# Patient Record
Sex: Female | Born: 1945 | Race: White | Hispanic: No | Smoking: Never smoker
Health system: Southern US, Community
[De-identification: ages and names within clinical notes are randomized; demographics above are authoritative.]

## PROBLEM LIST (undated history)

## (undated) DIAGNOSIS — N059 Unspecified nephritic syndrome with unspecified morphologic changes: Secondary | ICD-10-CM

## (undated) HISTORY — PX: RADICAL HYSTERECTOMY WITH TRANSPOSITION OF OVARIES: SHX6222

## (undated) HISTORY — DX: Unspecified nephritic syndrome with unspecified morphologic changes: N05.9

---

## 1973-05-14 HISTORY — PX: APPENDECTOMY: SHX54

## 1973-05-14 HISTORY — PX: ABDOMINAL HYSTERECTOMY: SHX81

## 1994-05-14 HISTORY — PX: FRACTURE SURGERY: SHX138

## 1996-05-14 HISTORY — PX: CHOLECYSTECTOMY: SHX55

## 2007-05-15 HISTORY — PX: CATARACT EXTRACTION: SUR2

## 2009-03-24 ENCOUNTER — Ambulatory Visit (HOSPITAL_COMMUNITY): Admission: RE | Admit: 2009-03-24 | Discharge: 2009-03-24 | Payer: Self-pay | Admitting: Pulmonary Disease

## 2010-05-14 LAB — HM DEXA SCAN: HM Dexa Scan: ABNORMAL

## 2010-05-14 LAB — HM PAP SMEAR: HM Pap smear: NORMAL

## 2010-06-04 ENCOUNTER — Encounter: Payer: Self-pay | Admitting: Pulmonary Disease

## 2011-05-15 LAB — HM MAMMOGRAPHY: HM Mammogram: NORMAL

## 2014-07-23 ENCOUNTER — Ambulatory Visit: Payer: Self-pay

## 2014-08-05 ENCOUNTER — Ambulatory Visit: Payer: Self-pay | Admitting: Obstetrics and Gynecology

## 2014-08-05 LAB — CREATININE, SERUM
Creatinine: 0.77 mg/dL
EGFR (African American): >60
EGFR (Non-African Amer.): >60

## 2014-08-26 ENCOUNTER — Ambulatory Visit
Admit: 2014-08-26 | Disposition: A | Discharge status: Home / Self Care | Payer: Self-pay | Admitting: Obstetrics and Gynecology

## 2014-08-26 DIAGNOSIS — Z01818 Encounter for other preprocedural examination: Secondary | ICD-10-CM

## 2014-08-26 LAB — CBC
HCT: 45 % (ref 35.0–47.0)
HGB: 14.8 g/dL (ref 12.0–16.0)
MCH: 31.7 pg (ref 26.0–34.0)
MCHC: 32.8 g/dL (ref 32.0–36.0)
MCV: 96 fL (ref 80–100)
PLATELETS: 241 10*3/uL (ref 150–440)
RBC: 4.66 10*6/uL (ref 3.80–5.20)
RDW: 12.8 % (ref 11.5–14.5)
WBC: 7.4 10*3/uL (ref 3.6–11.0)

## 2014-08-26 LAB — BASIC METABOLIC PANEL
Anion Gap: 6 — ABNORMAL LOW (ref 7–16)
BUN: 19 mg/dL
CHLORIDE: 107 mmol/L
CO2: 29 mmol/L
CREATININE: 0.73 mg/dL
Calcium, Total: 9 mg/dL
Glucose: 95 mg/dL
POTASSIUM: 3.6 mmol/L
Sodium: 142 mmol/L

## 2014-08-30 ENCOUNTER — Ambulatory Visit
Admit: 2014-08-30 | Disposition: A | Discharge status: Home / Self Care | Payer: Self-pay | Attending: Obstetrics and Gynecology | Admitting: Obstetrics and Gynecology

## 2014-08-30 HISTORY — PX: LAPAROSCOPY: SHX197

## 2014-09-06 LAB — SURGICAL PATHOLOGY

## 2014-09-12 NOTE — Op Note (Signed)
PATIENT NAME:  Kim Ayala, KLICH MR#:  [id number] DATE OF BIRTH:  1946-04-30  DATE OF PROCEDURE:  08/30/2014  PREOPERATIVE DIAGNOSES: Left adnexal mass.   POSTOPERATIVE DIAGNOSIS: Left adnexal mass and dense pelvic adhesive disease.   SURGICAL PROCEDURE: Laparoscopic bilateral salpingo-oophorectomy and adhesiolysis.   SURGEON: Alanda Slim. DeFrancesco, MD.   FIRST ASSISTANT: Anika S. Marcelline Mates, MD.    SECOND ASSISTANT: Rickard Patience, PA-S.   ANESTHESIA: General.   INDICATIONS: The patient is a 69 year old white female with a 10 cm adnexal mass. Preoperative ultrasound demonstrated a simple cyst. MRI showed minimal wall calcification without excrescences or nodularity. CA-125 was normal.   FINDINGS AT SURGERY: Revealed a 10 cm left adnexal mass with adjacent adhesions between the omentum, the anterior abdominal wall, and the pelvic sidewall. These were taken down sharply and bluntly. The right tube and ovary likewise were adhesed against the pelvic sidewall. Adhesiolysis had to be performed in order to mobilize the right tube and ovary. Both were excised. The ureters were noted to have normal function and course following completion of the BSO.     DESCRIPTION OF PROCEDURE: The patient was brought to the operating room where she was placed in the supine position. General endotracheal anesthesia was induced without difficulty. She was placed in dorsal lithotomy position using bumblebee stirrups. A ChloraPrep and Betadine abdominal, perineal, and intravaginal prep and drape was performed in standard fashion. A red Robinson catheter was used to drain 100 mL of urine from the bladder. A sponge stick was placed into the vagina to help facilitate anatomic orientation during the surgery. A subumbilical vertical incision 5 mm in length was made. The Optiview laparoscopic trocar system was placed under direct visualization with aid of the camera and revealed no bowel or vascular injury. Extensive adhesions were  noted along the pelvic sidewalls as well as the anterior abdominal wall and involving the left and right adnexa. An 11 mm port was placed in the right lower quadrant under direct visualization. The adhesiolysis was performed with the Ace Harmonic scalpel. Once able to place a left-sided 5 mm port, this was accomplished under direct visualization. Further adhesiolysis was performed in order to restore normal anatomic orientation. Once satisfied with the adhesiolysis the right tube and ovary were excised in standard fashion. The Harmonic scalpel was used to crossclamp, desiccate, and cut the right infundibulopelvic ligament as well as the remainder of the mesosalpinx that was present. Once this structure was mobilized and excised it was placed in the cul-de-sac for removal at a later time. The left adnexal mass was then sequentially taken down from its pedicles with the aid of the graspers and the Ace Harmonic scalpel. Adhesiolysis was performed in order to get the structure away from the ureter on the left pelvic sidewall. Bowel adhesions were taken down as well as sidewall adhesions. Once adequately mobilized the infundibulopelvic ligament was clamped, desiccated, and cut. This was followed by further adhesiolysis of the remainder of the mesosalpinx. Once mobilized the specimen was placed into a retrieval bag. The adnexal structure could not be placed in its entirety in that bag and therefore it had to be decompressed with needle aspiration. 60 mL of fluid were removed from the cyst with decompression of the cyst to allow it to gain entry into the removal bag. This was followed by further removal of the cyst fluid with aspiration once it was brought to the anterior abdominal wall. The remainder of the mass was removed through the right lower quadrant incision  without difficulty. No apparent spill of fluid was noted. The right tube and ovary were then taken out with the aid of an Endo Catch instrument. This was  followed by copious irrigation of the pelvis along with aspiration of the irrigant fluid. Good hemostasis was noted. All instrumentation was then removed from the abdominopelvic cavity. The pneumoperitoneum was released. All incisions were closed before completion of the surgical procedure. The fascia was closed with 0 Vicryl in a simple running manner. The subcutaneous tissues were brought together with subcuticular stitches of 4-0 plain. Telfa pressure dressings were applied. The patient was then awakened, extubated, and taken to the recovery room in satisfactory condition.   ESTIMATED BLOOD LOSS: Minimal.   IV FLUIDS: 1500 mL.   URINE OUTPUT: 100 mL.   COUNTS: All instrument, needles, and sponge counts were verified as correct.    ____________________________ Alanda Slim. DeFrancesco, MD mad:bu D: 08/30/2014 13:22:55 ET T: 08/30/2014 17:47:22 ET JOB#: [id number]  cc: Hassell Done A. DeFrancesco, MD, <Dictator> [facility] MD ELECTRONICALLY SIGNED 09/06/2014 8:49

## 2014-10-18 DIAGNOSIS — L905 Scar conditions and fibrosis of skin: Secondary | ICD-10-CM

## 2014-10-18 DIAGNOSIS — N289 Disorder of kidney and ureter, unspecified: Secondary | ICD-10-CM | POA: Insufficient documentation

## 2014-10-18 DIAGNOSIS — N059 Unspecified nephritic syndrome with unspecified morphologic changes: Secondary | ICD-10-CM

## 2014-10-18 DIAGNOSIS — M810 Age-related osteoporosis without current pathological fracture: Secondary | ICD-10-CM

## 2014-10-18 DIAGNOSIS — N9489 Other specified conditions associated with female genital organs and menstrual cycle: Secondary | ICD-10-CM

## 2014-10-18 DIAGNOSIS — Z8742 Personal history of other diseases of the female genital tract: Secondary | ICD-10-CM | POA: Insufficient documentation

## 2014-10-18 HISTORY — DX: Unspecified nephritic syndrome with unspecified morphologic changes: N05.9

## 2014-10-20 ENCOUNTER — Encounter: Payer: Self-pay | Admitting: Obstetrics and Gynecology

## 2014-10-20 ENCOUNTER — Ambulatory Visit (INDEPENDENT_AMBULATORY_CARE_PROVIDER_SITE_OTHER): Payer: Commercial Managed Care - HMO | Admitting: Obstetrics and Gynecology

## 2014-10-20 VITALS — BP 116/67 | HR 75 | Ht 60.0 in | Wt 133.9 lb

## 2014-10-20 DIAGNOSIS — L905 Scar conditions and fibrosis of skin: Secondary | ICD-10-CM | POA: Insufficient documentation

## 2014-10-20 DIAGNOSIS — N958 Other specified menopausal and perimenopausal disorders: Secondary | ICD-10-CM | POA: Diagnosis not present

## 2014-10-20 DIAGNOSIS — E8940 Asymptomatic postprocedural ovarian failure: Secondary | ICD-10-CM

## 2014-10-20 NOTE — Patient Instructions (Addendum)
1. Reassurance is given regarding postoperative findings. Patient does understand that the surgical sites sites scarring is lessening over time.  2. Recommend continued observation.  3. Consider plastic surgery consultation if desired.  4. RTC in March 2017 for Medicare exam.

## 2014-10-20 NOTE — Progress Notes (Signed)
GYN ENCOUNTER NOTE  Subjective:       Kim Ayala is a 69 y.o. G40P1001 female is here for gynecologic evaluation of the following issues:  1. Abdominal soft tissue scarring. 2.  Status post laparoscopic BSO. 3.  Vasomotor symptoms.  Kim Ayala presents today for follow-up on the above issues.  She reports normal bowel and bladder function.  She does not have chronic pain; however, she does experience some discomfort when working out at the gym with pain at the right lower quadrant laparoscopy port sites. Patient is also experiencing some hot flashes and night sweats which are not significant enough to warrant medical treatment.   Gynecologic History No LMP recorded. Patient has had a hysterectomy. Contraception: status post hysterectomy   Obstetric History OB History  Gravida Para Term Preterm AB SAB TAB Ectopic Multiple Living  1 1 1       1     # Outcome Date GA Lbr Len/2nd Weight Sex Delivery Anes PTL Lv  1 Term 1964   7 lb 12 oz (3.515 kg) M Vag-Spont   Y      Past Medical History  Diagnosis Date  . 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 Appendectomy  1975  . Fracture surgery  1996    bone  . Cholecystectomy  1998  . Cataract extraction  2009  . Abdominal hysterectomy  1975    unsure of type  . Laparoscopy Bilateral 08/30/2014    salpingo-oophorectomy and adhesiolysis    Current Outpatient Prescriptions on File Prior to Visit  Medication Sig Dispense Refill  . calcium carbonate (OS-CAL) 600 MG TABS tablet Take 600 mg by mouth.    . Multiple Vitamins-Minerals (MULTIVITAMIN WITH MINERALS) tablet Take 1 tablet by mouth daily.     No current facility-administered medications on file prior to visit.    Allergies  Allergen Reactions  . Asa [Aspirin]     History   Social History  . Marital Status: Divorced    Spouse Name: N/A  . Number of Children: N/A  . Years of Education: N/A   Occupational  History  . Not on file.   Social History Main Topics  . Smoking status: Never Smoker   . Smokeless tobacco: Never Used  . Alcohol Use: No  . Drug Use: No  . Sexual Activity: Not Currently   Other Topics Concern  . Not on file   Social History Narrative    Family History  Problem Relation Age of Onset  . Heart disease Mother     The following portions of the patient's history were reviewed and updated as appropriate: allergies, current medications, past family history, past medical history, past social history, past surgical history and problem list.  Review of Systems Review of Systems - General ROS: negative for - chills, fatigue, fever, malaise. POSITIVE- hot flashes, night sweats Hematological and Lymphatic ROS: negative for - bleeding problems or swollen lymph nodes Gastrointestinal ROS: negative for - abdominal pain, blood in stools, change in bowel habits and nausea/vomiting Musculoskeletal ROS: negative for - joint pain, muscle pain or muscular weakness Genito-Urinary ROS: negative for - change in menstrual cycle, dysmenorrhea, dyspareunia, dysuria, genital discharge, genital ulcers, hematuria, incontinence, irregular/heavy menses, nocturia or pelvic pain  Objective:   BP 116/67 mmHg  Pulse 75  Ht 5' (1.524 m)  Wt 133 lb 14.4 oz (60.737 kg)  BMI 26.15 kg/m2 CONSTITUTIONAL: Well-developed, well-nourished female in no acute distress.  HENT:  Normocephalic,  atraumatic.  SKIN: Skin is warm and dry. No rash noted. Not diaphoretic. No erythema. No pallor. NEUROLOGIC: Alert and oriented to person, place, and time. PSYCHIATRIC: Normal mood and affect. Normal behavior. Normal judgment and thought content. CARDIOVASCULAR:Not Examined RESPIRATORY: Not Examined BREASTS: Not Examined ABDOMEN: Soft, non distended; Non tender.  No Organomegaly. Laparoscopy incision sites are well-healed without evidence of hernia.  The patient does have a moderate soft tissue pannus with some  asymmetry on the right; superficial subcutaneous tissue is retracted towards the right lower quadrant fascial port site; no hernia.( asymmetry is less than previously noted 4 weeks ago.) PELVIC: Deferred MUSCULOSKELETAL: Normal range of motion. No tenderness.  No cyanosis, clubbing, or edema.     Assessment:   1.  Surgical menopause, minimally symptomatic, not desiring hormonal therapies. 2.  Abdominal wall, scarring, asymptomatic.   Plan:  1.  Reassurance is given regarding postoperative findings.  Patient does understand that the surgical sites sites scarring is lessening over time. 2.  Recommend continued observation. 3.  Consider plastic surgery consultation if desired. 4.  RTC in March 2017 for Medicare exam.  A total of 15 minutes were spent face-to-face with the patient during this encounter and over half of that time dealt with counseling and coordination of care.

## 2015-03-21 ENCOUNTER — Ambulatory Visit (INDEPENDENT_AMBULATORY_CARE_PROVIDER_SITE_OTHER): Payer: Commercial Managed Care - HMO | Admitting: Family Medicine

## 2015-03-21 ENCOUNTER — Encounter: Payer: Self-pay | Admitting: Family Medicine

## 2015-03-21 VITALS — BP 99/58 | HR 64 | Resp 16 | Ht 60.5 in | Wt 132.8 lb

## 2015-03-21 DIAGNOSIS — Z1231 Encounter for screening mammogram for malignant neoplasm of breast: Secondary | ICD-10-CM

## 2015-03-21 DIAGNOSIS — Z23 Encounter for immunization: Secondary | ICD-10-CM | POA: Diagnosis not present

## 2015-03-21 DIAGNOSIS — Z0000 Encounter for general adult medical examination without abnormal findings: Secondary | ICD-10-CM | POA: Diagnosis not present

## 2015-03-21 NOTE — Progress Notes (Signed)
Patient: Kim Ayala, Female    DOB: 14-Sep-1945, 69 y.o.   MRN: [id number] Visit Date: 03/21/2015  Today's Provider: Dicky Doe, MD   Chief Complaint  Patient presents with  . Medicare Wellness    subsiquent    Subjective:    Annual wellness visit Kim Ayala is a 69 y.o. female who presents today for her Subsequent Annual Wellness Visit. She feels fairly well. She reports exercising . She reports she is sleeping fairly well.   ----------------------------------------------------------- HPI  Review of Systems  Social History   Social History  . Marital Status: Divorced    Spouse Name: N/A  . Number of Children: N/A  . Years of Education: N/A   Occupational History  . Not on file.   Social History Main Topics  . Smoking status: Never Smoker   . Smokeless tobacco: Never Used  . Alcohol Use: No  . Drug Use: No  . Sexual Activity: Not Currently   Other Topics Concern  . Not on file   Social History Narrative    Patient Active Problem List   Diagnosis Date Noted  . Scar of abdominal wall 10/20/2014  . Kidney disease 10/18/2014  . History of heavy periods 10/18/2014  . Osteoporosis 10/18/2014  . 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 Adnexal mass 10/18/2014    Past Surgical History  Procedure Laterality Date  . Appendectomy  1975  . Fracture surgery  1996    bone  . Cholecystectomy  1998  . Cataract extraction  2009  . Abdominal hysterectomy  1975    unsure of type  . Laparoscopy Bilateral 08/30/2014    salpingo-oophorectomy and adhesiolysis  . Radical hysterectomy with transposition of ovaries      Her family history includes Heart disease in her mother.    Previous Medications   MULTIPLE VITAMINS-MINERALS (MULTIVITAMIN WITH MINERALS) TABLET    Take 1 tablet by mouth daily.    Patient Care Team: Arlis Porta., MD as PCP - General (Family Medicine)     Objective:   Vitals: BP 99/58 mmHg  Pulse 64   Resp 16  Ht 5' 0.5" (1.537 m)  Wt 132 lb 12.8 oz (60.238 kg)  BMI 25.50 kg/m2  Physical Exam  Activities of Daily Living In your present state of health, do you have any difficulty performing the following activities: 03/21/2015  Hearing? N  Vision? N  Difficulty concentrating or making decisions? N  Walking or climbing stairs? N  Dressing or bathing? N  Doing errands, shopping? N  Preparing Food and eating ? N  Using the Toilet? N  In the past six months, have you accidently leaked urine? Y  Do you have problems with loss of bowel control? N  Managing your Medications? N  Managing your Finances? N  Housekeeping or managing your Housekeeping? N    Fall Risk Assessment Fall Risk  03/21/2015  Falls in the past year? No     Patient reports there are not safety devices in place in shower at home.   Depression Screen PHQ 2/9 Scores 03/21/2015  PHQ - 2 Score 0     MMSE MMSE - Mini Mental State Exam 03/21/2015  Orientation to time 5  Orientation to Place 5  Registration 3  Attention/ Calculation 5  Recall 3  Language- name 2 objects 2  Language- repeat 1  Language- follow 3 step command 3  Language- read & follow direction 1  Write a sentence 1  Copy  design 1  Total score 30     Assessment & Plan:     Annual Wellness Visit  Reviewed patient's Family Medical History Reviewed and updated list of patient's medical providers Assessment of cognitive impairment was done Assessed patient's functional ability Established a written schedule for health screening [place] Completed and Reviewed  Exercise Activities and Dietary recommendations Goals    . Keep good health     Remain active.         There is no immunization history on file for this patient.  Health Maintenance  Topic Date Due  . PNA vac Low Risk Adult (1 of 2 - PCV13) 04/25/2015 (Originally 11/20/2009)  . MAMMOGRAM  06/06/2015 (Originally 05/14/2013)  . Hepatitis C  Screening  03/26/2016 (Originally 05-Aug-1945)  . ZOSTAVAX  04/02/2016 (Originally 11/20/2004)  . INFLUENZA VACCINE  12/13/2015  . TETANUS/TDAP  05/14/2017  . COLONOSCOPY  03/20/2025  . DEXA SCAN  Completed     Discussed health benefits of physical activity, and encouraged her to engage in regular exercise appropriate for her age and condition.    ------------------------------------------------------------------------------------------------------------   Problem List Items Addressed This Visit    None    Visit Diagnoses    Medicare annual wellness visit, subsequent    -  Primary    Encounter for screening mammogram for breast cancer        Relevant Orders    MM Digital Screening        Larene Beach, MD [facility]  03/21/2015  1. Medicare annual wellness visit, subsequent   2. Encounter for screening mammogram for breast cancer - MM Digital Screening; Future

## 2015-03-21 NOTE — Patient Instructions (Addendum)
Health Maintenance  Topic Date Due  . Hepatitis C Screening  18-May-1945  . MAMMOGRAM  05/14/2013  . ZOSTAVAX  05/14/2016 (Originally 11/20/2004)  . PNA vac Low Risk Adult (1 of 2 - PCV13) 05/14/2016 (Originally 11/20/2009)  . INFLUENZA VACCINE  12/13/2015  . TETANUS/TDAP  05/14/2017  . COLONOSCOPY  03/20/2025  . DEXA SCAN  Completed    To get Prevnar-13 within 1 month.

## 2015-04-12 ENCOUNTER — Ambulatory Visit: Payer: Self-pay | Admitting: Family Medicine

## 2015-07-19 ENCOUNTER — Ambulatory Visit (INDEPENDENT_AMBULATORY_CARE_PROVIDER_SITE_OTHER): Payer: Commercial Managed Care - HMO | Admitting: Family Medicine

## 2015-07-19 ENCOUNTER — Encounter: Payer: Self-pay | Admitting: Family Medicine

## 2015-07-19 VITALS — BP 103/50 | HR 58 | Resp 16 | Ht 60.25 in | Wt 132.6 lb

## 2015-07-19 DIAGNOSIS — Z0000 Encounter for general adult medical examination without abnormal findings: Secondary | ICD-10-CM | POA: Diagnosis not present

## 2015-07-19 DIAGNOSIS — Z23 Encounter for immunization: Secondary | ICD-10-CM

## 2015-07-19 DIAGNOSIS — Z1231 Encounter for screening mammogram for malignant neoplasm of breast: Secondary | ICD-10-CM

## 2015-07-19 DIAGNOSIS — N289 Disorder of kidney and ureter, unspecified: Secondary | ICD-10-CM

## 2015-07-19 NOTE — Progress Notes (Signed)
. Name: Kim Ayala   MRN: [id number]    DOB: 10/08/1945   Date:07/19/2015       Progress Note  Subjective  Chief Complaint  Chief Complaint  Patient presents with  . Annual Exam    HPI Here for complete yearly health maintenance physical.  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  Feels physically ok.  Some emotional problems related to deaths of friends and family. No problem-specific assessment & plan notes found for this encounter.   Past Medical History  Diagnosis Date  . 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 Appendectomy  1975  . Fracture surgery  1996    bone  . Cholecystectomy  1998  . Cataract extraction  2009  . Abdominal hysterectomy  1975    unsure of type  . Laparoscopy Bilateral 08/30/2014    salpingo-oophorectomy and adhesiolysis  . Radical hysterectomy with transposition of ovaries      Family History  Problem Relation Age of Onset  . Heart disease Mother     Social History   Social History  . Marital Status: Divorced    Spouse Name: N/A  . Number of Children: N/A  . Years of Education: N/A   Occupational History  . Not on file.   Social History Main Topics  . Smoking status: Never Smoker   . Smokeless tobacco: Never Used  . Alcohol Use: No  . Drug Use: No  . Sexual Activity: Not Currently   Other Topics Concern  . Not on file   Social History Narrative     Current outpatient prescriptions:  .  Acetaminophen 500 MG coapsule, , Disp: , Rfl:  .  Multiple Vitamins-Minerals (MULTIVITAMIN WITH MINERALS) tablet, Take 1 tablet by mouth daily., Disp: , Rfl:  .  vitamin C (ASCORBIC ACID) 500 MG tablet, , Disp: , Rfl:   Allergies  Allergen Reactions  . Asa [Aspirin]      Review of Systems  Constitutional: Negative for fever, chills, weight loss and malaise/fatigue.  HENT: Negative for hearing loss.   Eyes: Negative for blurred vision and double vision.   Respiratory: Negative for cough, shortness of breath and wheezing.   Cardiovascular: Negative for chest pain, palpitations and leg swelling.  Gastrointestinal: Negative for heartburn, abdominal pain and blood in stool.  Genitourinary: Negative for dysuria, urgency and frequency.  Musculoskeletal: Negative for myalgias and joint pain.  Skin: Negative for rash.  Neurological: Negative for dizziness, tremors, weakness and headaches.  Psychiatric/Behavioral: Negative for depression. The patient is not nervous/anxious and does not have insomnia.       Objective  Filed Vitals:   07/19/15 1411  BP: 103/50  Pulse: 58  Resp: 16  Height: 5' 0.25" (1.53 m)  Weight: 132 lb 9.6 oz (60.147 kg)    Physical Exam  Constitutional: She is well-developed, well-nourished, and in no distress. No distress.  HENT:  Head: Normocephalic and atraumatic.  Vitals reviewed.      No results found for this or any previous visit (from the past 2160 hour(s)).   Assessment & Plan  Problem List Items Addressed This Visit      Genitourinary   Kidney disease   Relevant Orders   Comprehensive Metabolic Panel (CMET)   CBC with Differential   Lipid Profile     Other   Encounter for screening mammogram for breast cancer   Relevant Orders   MM Digital Screening   Need for  influenza vaccination   Health maintenance examination - Primary    Other Visit Diagnoses    Immunization due           Meds ordered this encounter  Medications  . vitamin C (ASCORBIC ACID) 500 MG tablet    Sig:   . Acetaminophen 500 MG coapsule    Sig:    1. Health maintenance examination   2. Encounter for screening mammogram for breast cancer  - MM Digital Screening; Future  3. Kidney disease  - Comprehensive Metabolic Panel (CMET) - CBC with Differential - Lipid Profile  4. Need for influenza vaccination   5. Immunization due

## 2015-09-24 LAB — LIPID PANEL
CHOL/HDL RATIO: 2.7 ratio (ref 0.0–4.4)
Cholesterol, Total: 162 mg/dL (ref 100–199)
HDL: 59 mg/dL (ref >39)
LDL CALC: 84 mg/dL (ref 0–99)
Triglycerides: 95 mg/dL (ref 0–149)
VLDL CHOLESTEROL CAL: 19 mg/dL (ref 5–40)

## 2015-09-24 LAB — COMPREHENSIVE METABOLIC PANEL
A/G RATIO: 1.7 (ref 1.2–2.2)
ALT: 17 IU/L (ref 0–32)
AST: 26 IU/L (ref 0–40)
Albumin: 4 g/dL (ref 3.5–4.8)
Alkaline Phosphatase: 103 IU/L (ref 39–117)
BUN/Creatinine Ratio: 18 (ref 12–28)
BUN: 16 mg/dL (ref 8–27)
Bilirubin Total: 0.3 mg/dL (ref 0.0–1.2)
CALCIUM: 8.9 mg/dL (ref 8.7–10.3)
CO2: 24 mmol/L (ref 18–29)
CREATININE: 0.87 mg/dL (ref 0.57–1.00)
Chloride: 102 mmol/L (ref 96–106)
GFR, EST AFRICAN AMERICAN: 78 mL/min/{1.73_m2} (ref >59)
GFR, EST NON AFRICAN AMERICAN: 68 mL/min/{1.73_m2} (ref >59)
Globulin, Total: 2.3 g/dL (ref 1.5–4.5)
Glucose: 81 mg/dL (ref 65–99)
Potassium: 4.4 mmol/L (ref 3.5–5.2)
Sodium: 143 mmol/L (ref 134–144)
TOTAL PROTEIN: 6.3 g/dL (ref 6.0–8.5)

## 2015-09-24 LAB — CBC WITH DIFFERENTIAL/PLATELET
BASOS: 1 %
Basophils Absolute: 0 10*3/uL (ref 0.0–0.2)
EOS (ABSOLUTE): 0.4 10*3/uL (ref 0.0–0.4)
EOS: 6 %
HEMATOCRIT: 41.6 % (ref 34.0–46.6)
HEMOGLOBIN: 14.7 g/dL (ref 11.1–15.9)
IMMATURE GRANS (ABS): 0 10*3/uL (ref 0.0–0.1)
IMMATURE GRANULOCYTES: 0 %
LYMPHS: 42 %
Lymphocytes Absolute: 2.6 10*3/uL (ref 0.7–3.1)
MCH: 33 pg (ref 26.6–33.0)
MCHC: 35.3 g/dL (ref 31.5–35.7)
MCV: 93 fL (ref 79–97)
MONOCYTES: 8 %
Monocytes Absolute: 0.5 10*3/uL (ref 0.1–0.9)
NEUTROS ABS: 2.8 10*3/uL (ref 1.4–7.0)
NEUTROS PCT: 43 %
PLATELETS: 243 10*3/uL (ref 150–379)
RBC: 4.46 x10E6/uL (ref 3.77–5.28)
RDW: 13.2 % (ref 12.3–15.4)
WBC: 6.3 10*3/uL (ref 3.4–10.8)

## 2015-09-26 ENCOUNTER — Encounter: Payer: Self-pay | Admitting: *Deleted

## 2015-11-18 ENCOUNTER — Telehealth: Payer: Self-pay

## 2015-11-18 NOTE — Telephone Encounter (Signed)
error 

## 2016-01-24 ENCOUNTER — Ambulatory Visit: Payer: Self-pay | Admitting: Family Medicine

## 2016-03-20 ENCOUNTER — Ambulatory Visit (INDEPENDENT_AMBULATORY_CARE_PROVIDER_SITE_OTHER): Payer: Commercial Managed Care - HMO

## 2016-03-20 DIAGNOSIS — Z23 Encounter for immunization: Secondary | ICD-10-CM | POA: Diagnosis not present

## 2016-04-26 ENCOUNTER — Ambulatory Visit (INDEPENDENT_AMBULATORY_CARE_PROVIDER_SITE_OTHER): Payer: Commercial Managed Care - HMO | Admitting: Family Medicine

## 2016-04-26 ENCOUNTER — Encounter: Payer: Self-pay | Admitting: Family Medicine

## 2016-04-26 VITALS — BP 128/51 | HR 72 | Temp 98.4°F | Resp 16 | Ht 60.0 in | Wt 135.0 lb

## 2016-04-26 DIAGNOSIS — B001 Herpesviral vesicular dermatitis: Secondary | ICD-10-CM | POA: Diagnosis not present

## 2016-04-26 MED ORDER — AMOXICILLIN 250 MG PO CAPS: 250.0000 mg | ORAL_CAPSULE | Freq: Three times a day (TID) | ORAL | 0 refills | Status: DC

## 2016-04-26 MED ORDER — VALACYCLOVIR HCL 1 G PO TABS: ORAL_TABLET | ORAL | 3 refills | Status: DC

## 2016-04-26 NOTE — Assessment & Plan Note (Signed)
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 No evidence of bacterial superinfection or complication. - No suppressive therapy, never on anti viral medications  Plan: 1. Start Valtrex - initial day 1 dose at 2000mg  (x 2 of the 1000mg  tabs) BID for 1 day, then if persistent can take reduced dose 1000mg  BID for 3-5 days, given that she is not starting therapy within 24 hours of onset. In future given refills she may take only the one day of high dose therapy if resolves it 2. Also printed rx of prior Amoxicillin 250mg  TID for 10 days so she may take this to [place] with her, incase it seems to have bacterial infection or worsening, this rx was patient preference 3. Reviewed strict return criteria, if bacterial infection or worsening - does not seem like she has frequent flares to indicate suppression therapy

## 2016-04-26 NOTE — Patient Instructions (Signed)
Thank you for coming in to clinic today.  1. You have a cold sore (most often caused by a herpes virus, HSV1) this is a very common virus that most people are exposed to, often the cold sore will come up in times of acute stress, usually upper lip and can spread. - Even though it is caused by a virus that remains in the body, it can be infected by bacterial as well if it is an open sore or gets in contact with other bacteria on the skin  Start with Valtrex (valacyclovir) 1000mg  tablets, take 2 tablets per dose TWICE a day for 1 day. This may completely resolve it, however since it has been present for 2 days now, you may need extended therapy for 1 tablet twice a day for 3-5 more days. - In the future, I gave you refills, you may have this medicine on hand for taking just the first day dose (2 tablets twice a day only) and it may resolve it if it is very early on or within first 24 hours of onset  If you are not improving after a few days or it seems to spread closer to nose, have pus, more redness, swelling pain, or fevers, go ahead and start the Amoxicillin 3 times a day for 10 days. If it is not improving then go seek more immediate medical attention.  Please schedule a follow-up appointment with Dr. Parks Ranger as needed within next 1-2 weeks for cold sore, otherwise follow-up for yearly annual physical exam  If you have any other questions or concerns, please feel free to call the clinic or send a message through [place]. You may also schedule an earlier appointment if necessary.  [name], DO [place]

## 2016-04-26 NOTE — Progress Notes (Signed)
Subjective:    Patient ID: Kim Ayala, female    DOB: 02/13/1946, 70 y.o.   MRN: [id number]  Kim Ayala is a 70 y.o. female presenting on 04/26/2016 for Mouth Lesions (onset yesterday)   Patient presents for a same day appointment.  HPI  COLD SORE, Upper Lip, Recurrent: - Reports chronic recurrent problem with intermittent upper lip cold sore, current flare onset about 2 days ago with some swelling early on prior to onset of sore, slight redness and scab with burning or tingling pain. Triggered by recent acute stressor involving managing her parent's estate. Previously these flares have been triggered by significant stress. She only rarely gets these cold sores maybe 1x yearly or less, not monthly and no other related trigger. - Prior flare in past was treated with antibiotic Amoxicillin 250mg  TID for 10 days as it had spread closer to her nose and she was advised by previous doctor to treat with antibiotic to prevent deeper spreading infection - Additionally, requesting rx today because she is going on vacation this weekend to [place] for holidays - Denies any other skin rash, ulceration, redness, swelling, pain, drainage of pus, fever/chills  Social History  Substance Use Topics  . Smoking status: Never Smoker  . Smokeless tobacco: Never Used  . Alcohol use No    Review of Systems Per HPI unless specifically indicated above     Objective:    BP (!) 128/51   Pulse 72   Temp 98.4 F (36.9 C) (Oral)   Resp 16   Ht 5' (1.524 m)   Wt 135 lb (61.2 kg)   BMI 26.37 kg/m   Wt Readings from Last 3 Encounters:  04/26/16 135 lb (61.2 kg)  07/19/15 132 lb 9.6 oz (60.1 kg)  03/21/15 132 lb 12.8 oz (60.2 kg)    Physical Exam  Constitutional: She appears well-developed and well-nourished. No distress.  Well-appearing, comfortable, cooperative  HENT:  Head: Normocephalic.  Mouth/Throat: Oropharynx is clear and moist.  Nares patent without purulence or edema. Oropharynx clear  without lesions, ulceration, erythema, exudates, edema or asymmetry.  Upper lip midline with small 1x1 cm raised swollen sore, without significant extending erythema, induration or ulceration without drainage of pus.  Eyes: Conjunctivae are normal. Right eye exhibits no discharge. Left eye exhibits no discharge.  Neck: Neck supple.  Cardiovascular: Normal rate and intact distal pulses.   Pulmonary/Chest: Effort normal.  Lymphadenopathy:    She has no cervical adenopathy.  Neurological: She is alert.  Skin: Skin is warm and dry. She is not diaphoretic.  Nursing note and vitals reviewed.       Results for orders placed or performed in visit on 07/19/15  Comprehensive Metabolic Panel (CMET)  Result Value Ref Range   Glucose 81 65 - 99 mg/dL   BUN 16 8 - 27 mg/dL   Creatinine, Ser 0.87 0.57 - 1.00 mg/dL   GFR calc non Af Amer 68 >59 mL/min/1.73   GFR calc Af Amer 78 >59 mL/min/1.73   BUN/Creatinine Ratio 18 12 - 28   Sodium 143 134 - 144 mmol/L   Potassium 4.4 3.5 - 5.2 mmol/L   Chloride 102 96 - 106 mmol/L   CO2 24 18 - 29 mmol/L   Calcium 8.9 8.7 - 10.3 mg/dL   Total Protein 6.3 6.0 - 8.5 g/dL   Albumin 4.0 3.5 - 4.8 g/dL   Globulin, Total 2.3 1.5 - 4.5 g/dL   Albumin/Globulin Ratio 1.7 1.2 - 2.2   Bilirubin Total  0.3 0.0 - 1.2 mg/dL   Alkaline Phosphatase 103 39 - 117 IU/L   AST 26 0 - 40 IU/L   ALT 17 0 - 32 IU/L  CBC with Differential  Result Value Ref Range   WBC 6.3 3.4 - 10.8 x10E3/uL   RBC 4.46 3.77 - 5.28 x10E6/uL   Hemoglobin 14.7 11.1 - 15.9 g/dL   Hematocrit 41.6 34.0 - 46.6 %   MCV 93 79 - 97 fL   MCH 33.0 26.6 - 33.0 pg   MCHC 35.3 31.5 - 35.7 g/dL   RDW 13.2 12.3 - 15.4 %   Platelets 243 150 - 379 x10E3/uL   Neutrophils 43 %   Lymphs 42 %   Monocytes 8 %   Eos 6 %   Basos 1 %   Neutrophils Absolute 2.8 1.4 - 7.0 x10E3/uL   Lymphocytes Absolute 2.6 0.7 - 3.1 x10E3/uL   Monocytes Absolute 0.5 0.1 - 0.9 x10E3/uL   EOS (ABSOLUTE) 0.4 0.0 - 0.4  x10E3/uL   Basophils Absolute 0.0 0.0 - 0.2 x10E3/uL   Immature Granulocytes 0 %   Immature Grans (Abs) 0.0 0.0 - 0.1 x10E3/uL  Lipid Profile  Result Value Ref Range   Cholesterol, Total 162 100 - 199 mg/dL   Triglycerides 95 0 - 149 mg/dL   HDL 59 >39 mg/dL   VLDL Cholesterol Cal 19 5 - 40 mg/dL   LDL Calculated 84 0 - 99 mg/dL   Chol/HDL Ratio 2.7 0.0 - 4.4 ratio units      Assessment & Plan:   Problem List Items Addressed This Visit    Recurrent cold sores - Primary    Clinically consistent with upper lip cold sore x 2 days onset, likely triggered acute stressor, 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 No evidence of bacterial superinfection or complication. - No suppressive therapy, never on anti viral medications  Plan: 1. Start Valtrex - initial day 1 dose at 2000mg  (x 2 of the 1000mg  tabs) BID for 1 day, then if persistent can take reduced dose 1000mg  BID for 3-5 days, given that she is not starting therapy within 24 hours of onset. In future given refills she may take only the one day of high dose therapy if resolves it 2. Also printed rx of prior Amoxicillin 250mg  TID for 10 days so she may take this to [place] with her, incase it seems to have bacterial infection or worsening, this rx was patient preference 3. Reviewed strict return criteria, if bacterial infection or worsening - does not seem like she has frequent flares to indicate suppression therapy      Relevant Medications   valACYclovir (VALTREX) 1000 MG tablet   amoxicillin (AMOXIL) 250 MG capsule      Meds ordered this encounter  Medications  . valACYclovir (VALTREX) 1000 MG tablet    Sig: Take 2 tablets (2000mg ) twice a day for 1 day, and then may take 1 tablet twice a day for up to 3-5 days if still persistent.    Dispense:  15 tablet    Refill:  3  . amoxicillin (AMOXIL) 250 MG capsule    Sig: Take 1 capsule (250 mg total) by mouth 3 (three) times daily. For 10 days    Dispense:  30 capsule     Refill:  0      Follow up plan: Return in about 1 week (around 05/03/2016), or if symptoms worsen or fail to improve, for cold sore.  [name], DO [facility] 04/26/2016, 1:28 PM

## 2016-07-24 ENCOUNTER — Ambulatory Visit (INDEPENDENT_AMBULATORY_CARE_PROVIDER_SITE_OTHER): Payer: PPO | Admitting: Family Medicine

## 2016-07-24 ENCOUNTER — Encounter: Payer: Self-pay | Admitting: Family Medicine

## 2016-07-24 VITALS — BP 124/52 | HR 58 | Temp 98.4°F | Resp 16 | Ht 60.0 in | Wt 128.0 lb

## 2016-07-24 DIAGNOSIS — Z0000 Encounter for general adult medical examination without abnormal findings: Secondary | ICD-10-CM

## 2016-07-24 DIAGNOSIS — M810 Age-related osteoporosis without current pathological fracture: Secondary | ICD-10-CM | POA: Diagnosis not present

## 2016-07-24 NOTE — Progress Notes (Signed)
Name: Kim Ayala   MRN: [id number]    DOB: [birth year]   Date:07/24/2016       Progress Note  Subjective  Chief Complaint  No chief complaint on file.   HPI Here for yearly healthcare maintenance examination.  She had pelvic mass removed.  Now with some intermittant lower abd pain.   Overall she feels well.  Time for another mammogram. No problem-specific Assessment & Plan notes found for this encounter.   Past Medical History:  Diagnosis Date  . 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 ABDOMINAL HYSTERECTOMY  1975   unsure of type  . APPENDECTOMY  1975  . CATARACT EXTRACTION  2009  . CHOLECYSTECTOMY  1998  . FRACTURE SURGERY  1996   bone  . LAPAROSCOPY Bilateral 08/30/2014   salpingo-oophorectomy and adhesiolysis  . RADICAL HYSTERECTOMY WITH TRANSPOSITION OF OVARIES      Family History  Problem Relation Age of Onset  . Heart disease Mother     Social History   Social History  . Marital status: Divorced    Spouse name: N/A  . Number of children: N/A  . Years of education: N/A   Occupational History  . Not on file.   Social History Main Topics  . Smoking status: Never Smoker  . Smokeless tobacco: Never Used  . Alcohol use No  . Drug use: No  . Sexual activity: Not Currently   Other Topics Concern  . Not on file   Social History Narrative  . No narrative on file     Current Outpatient Prescriptions:  .  Acetaminophen 500 MG coapsule, , Disp: , Rfl:  .  Calcium 150 MG TABS, Take 150 mg by mouth daily., Disp: , Rfl:  .  Multiple Vitamins-Minerals (MULTIVITAMIN WITH MINERALS) tablet, Take 1 tablet by mouth daily., Disp: , Rfl:  .  vitamin C (ASCORBIC ACID) 500 MG tablet, , Disp: , Rfl:  .  vitamin E 100 UNIT capsule, Take by mouth daily., Disp: , Rfl:   Allergies  Allergen Reactions  . Asa [Aspirin]      Review of Systems  Constitutional: Negative for chills, fever, malaise/fatigue and  weight loss.  HENT: Negative for hearing loss and tinnitus.   Eyes: Negative for blurred vision and double vision.  Respiratory: Negative for cough, hemoptysis, shortness of breath and wheezing.   Cardiovascular: Negative for chest pain, palpitations and leg swelling.  Gastrointestinal: Negative for abdominal pain, blood in stool and heartburn.  Genitourinary: Negative for dysuria, frequency and urgency.  Musculoskeletal: Negative for joint pain and myalgias.  Skin: Negative for rash.  Neurological: Negative for dizziness, tingling, tremors, weakness and headaches.      Objective  Vitals:   07/24/16 1408  BP: (!) 124/52  Pulse: (!) 58  Resp: 16  Temp: 98.4 F (36.9 C)  TempSrc: Oral  Weight: 128 lb (58.1 kg)  Height: 5' (1.524 m)    Physical Exam  Constitutional: She is oriented to person, place, and time and well-developed, well-nourished, and in no distress. No distress.  HENT:  Head: Normocephalic and atraumatic.  Right Ear: External ear normal.  Left Ear: External ear normal.  Nose: Nose normal.  Mouth/Throat: Oropharynx is clear and moist.  Eyes: Conjunctivae are normal. Pupils are equal, round, and reactive to light. No scleral icterus.  Neck: Normal range of motion. Neck supple. Carotid bruit is not present. No thyromegaly present.  Cardiovascular: Normal rate, regular rhythm and normal heart  sounds.  Exam reveals no gallop and no friction rub.   No murmur heard. Pulmonary/Chest: Effort normal and breath sounds normal. No respiratory distress. She has no wheezes. She has no rales. Right breast exhibits no inverted nipple, no mass, no nipple discharge, no skin change and no tenderness. Left breast exhibits no inverted nipple, no mass, no nipple discharge, no skin change and no tenderness. Breasts are symmetrical.  Abdominal: Soft. Bowel sounds are normal. She exhibits no distension and no mass. There is no tenderness. There is no rebound and no guarding.  Tender scar  that is abnormal in appearance and tender.  Genitourinary: No vaginal discharge found.  Genitourinary Comments: No vaginal organs.   Some scarring in vaginal cuff area.  No tenderness.  Musculoskeletal: Normal range of motion. She exhibits no edema.  Lymphadenopathy:    She has no cervical adenopathy.  Neurological: She is alert and oriented to person, place, and time. No cranial nerve deficit. Gait normal. Coordination normal.  Skin: Skin is warm and dry.  Psychiatric: Mood, memory, affect and judgment normal.  Vitals reviewed.      No results found for this or any previous visit (from the past 2160 hour(s)).   Assessment & Plan  Problem List Items Addressed This Visit    None    Visit Diagnoses    Health care maintenance    -  Primary   Relevant Orders   COMPLETE METABOLIC PANEL WITH GFR   CBC with Differential   Lipid Profile   MM Digital Screening      Meds ordered this encounter  Medications  . vitamin E 100 UNIT capsule    Sig: Take by mouth daily.  . Calcium 150 MG TABS    Sig: Take 150 mg by mouth daily.   1. Health care maintenance  - COMPLETE METABOLIC PANEL WITH GFR  - CBC with Differential - Lipid Profile - MM Digital Screening; Future

## 2016-07-26 ENCOUNTER — Other Ambulatory Visit: Payer: Self-pay

## 2016-07-26 LAB — CBC WITH DIFFERENTIAL/PLATELET
Basophils Absolute: 61 cells/uL (ref 0–200)
Basophils Relative: 1 %
EOS PCT: 2 %
Eosinophils Absolute: 122 cells/uL (ref 15–500)
HCT: 43.2 % (ref 35.0–45.0)
HEMOGLOBIN: 14.2 g/dL (ref 11.7–15.5)
LYMPHS ABS: 2562 {cells}/uL (ref 850–3900)
Lymphocytes Relative: 42 %
MCH: 31.3 pg (ref 27.0–33.0)
MCHC: 32.9 g/dL (ref 32.0–36.0)
MCV: 95.4 fL (ref 80.0–100.0)
MONOS PCT: 7 %
MPV: 10.3 fL (ref 7.5–12.5)
Monocytes Absolute: 427 cells/uL (ref 200–950)
NEUTROS ABS: 2928 {cells}/uL (ref 1500–7800)
NEUTROS PCT: 48 %
PLATELETS: 251 10*3/uL (ref 140–400)
RBC: 4.53 MIL/uL (ref 3.80–5.10)
RDW: 13.8 % (ref 11.0–15.0)
WBC: 6.1 10*3/uL (ref 3.8–10.8)

## 2016-07-27 LAB — LIPID PANEL
CHOLESTEROL: 173 mg/dL (ref <200)
HDL: 55 mg/dL (ref >50)
LDL Cholesterol: 93 mg/dL (ref <100)
TRIGLYCERIDES: 123 mg/dL (ref <150)
Total CHOL/HDL Ratio: 3.1 Ratio (ref <5.0)
VLDL: 25 mg/dL (ref <30)

## 2016-07-27 LAB — COMPLETE METABOLIC PANEL WITH GFR
ALBUMIN: 3.7 g/dL (ref 3.6–5.1)
ALK PHOS: 90 U/L (ref 33–130)
ALT: 15 U/L (ref 6–29)
AST: 25 U/L (ref 10–35)
BUN: 12 mg/dL (ref 7–25)
CALCIUM: 9 mg/dL (ref 8.6–10.4)
CO2: 24 mmol/L (ref 20–31)
Chloride: 108 mmol/L (ref 98–110)
Creat: 0.74 mg/dL (ref 0.60–0.93)
GFR, EST NON AFRICAN AMERICAN: 82 mL/min (ref >=60)
GFR, Est African American: >89 mL/min (ref >=60)
Glucose, Bld: 78 mg/dL (ref 65–99)
POTASSIUM: 4 mmol/L (ref 3.5–5.3)
Sodium: 143 mmol/L (ref 135–146)
Total Bilirubin: 0.4 mg/dL (ref 0.2–1.2)
Total Protein: 6.1 g/dL (ref 6.1–8.1)

## 2016-07-30 ENCOUNTER — Encounter: Payer: Self-pay | Admitting: *Deleted

## 2016-10-24 IMAGING — US US PELV - US TRANSVAGINAL
1 series · 13 of 25 positions shown · non-contrast
Comparison: None

CLINICAL DATA: Adnexal mass palpated during physical exam. Prior
hysterectomy years ago.



[Series 1: us pelv - us transvaginal · 0.24mm/px · 13 of 88 slices shown]
[im 1/88]
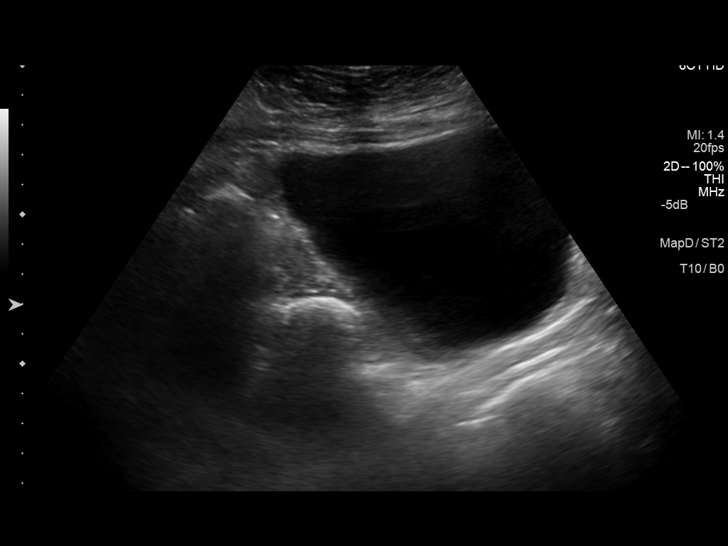
[im 8/88]
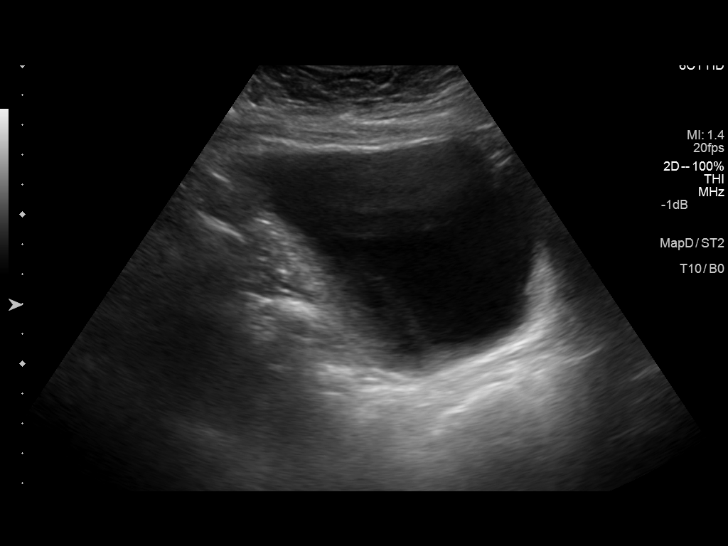
[im 15/88]
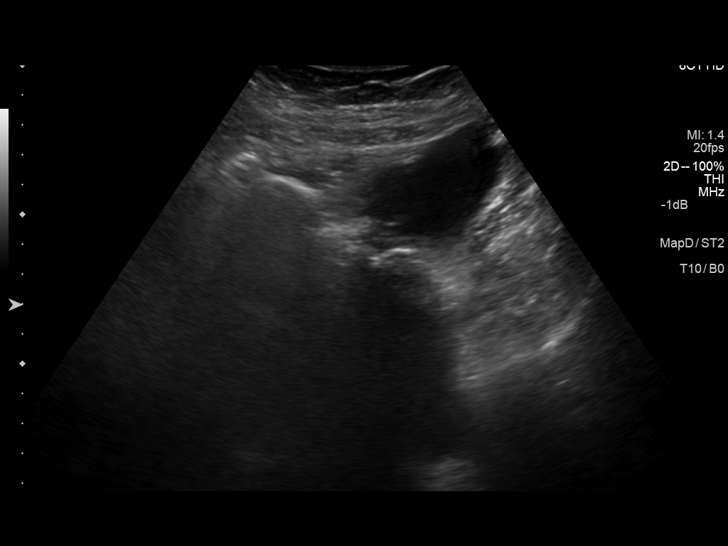
[im 22/88]
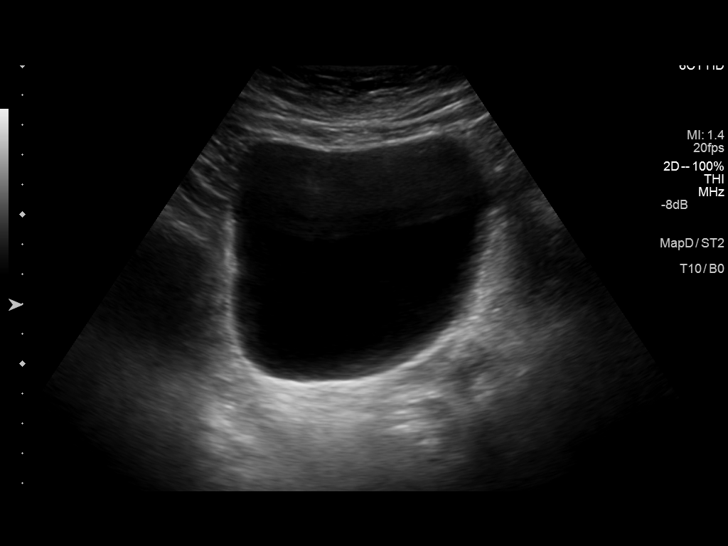
[im 30/88]
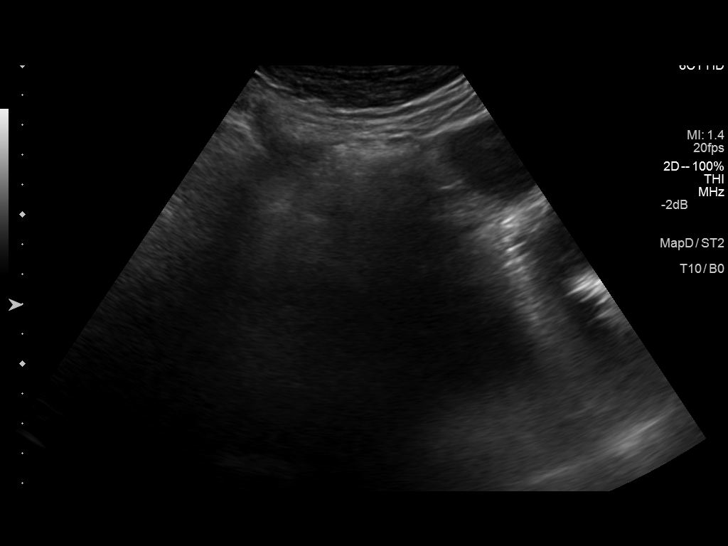
[im 37/88]
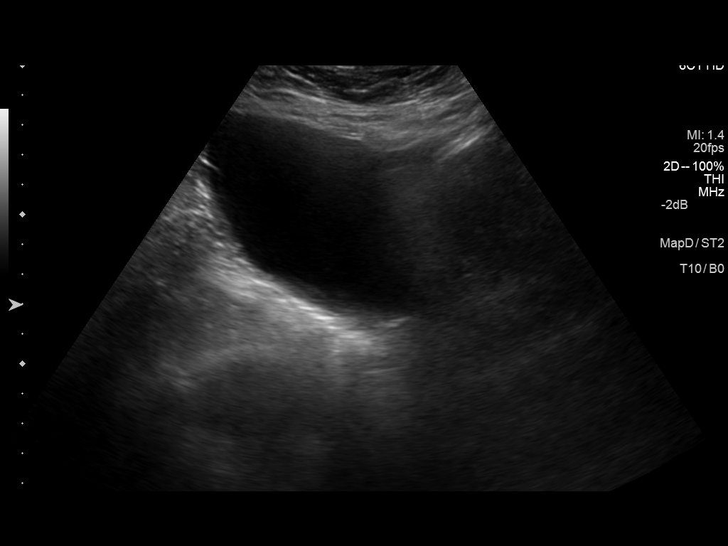
[im 44/88]
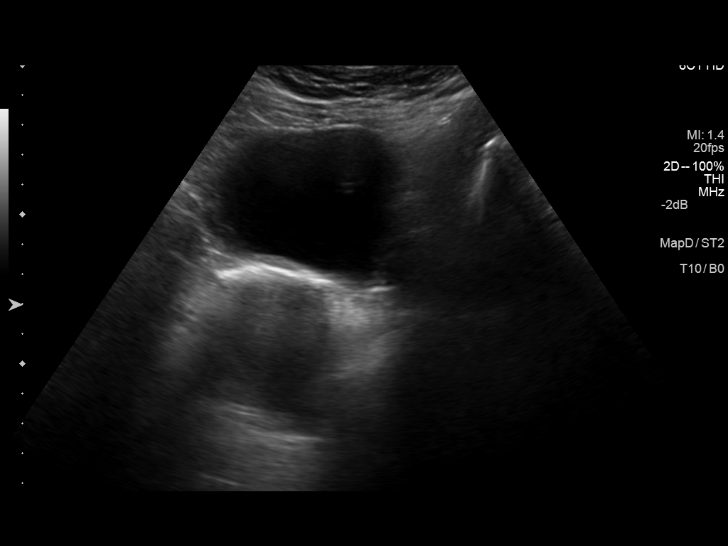
[im 51/88]
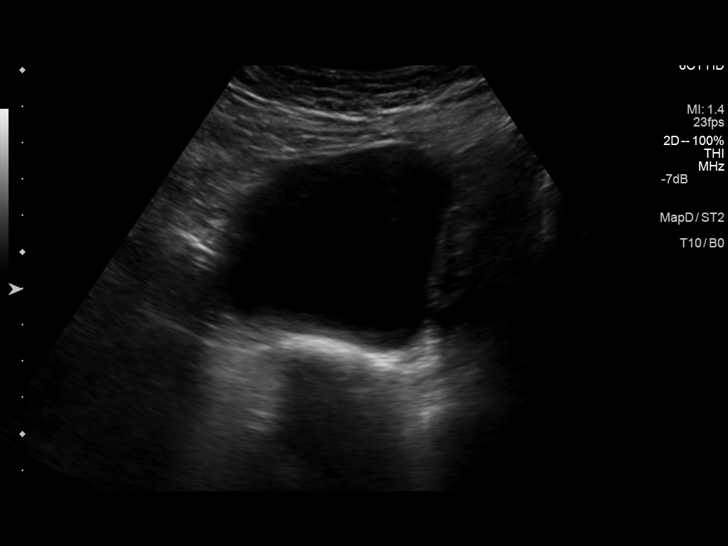
[im 59/88]
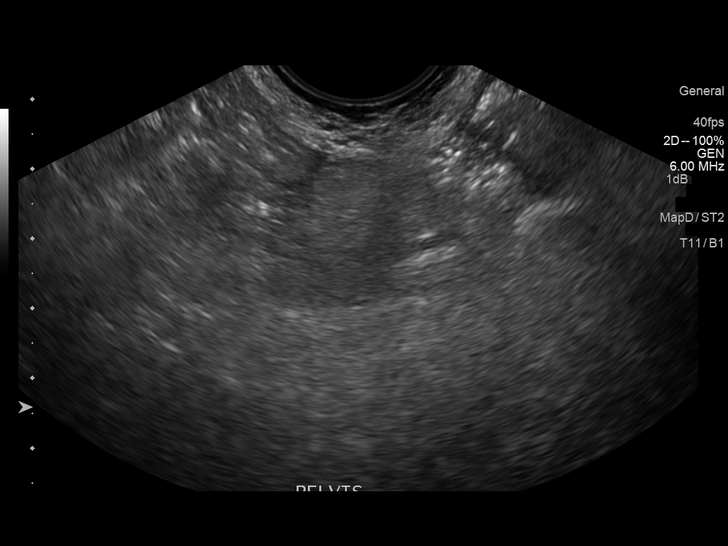
[im 66/88]
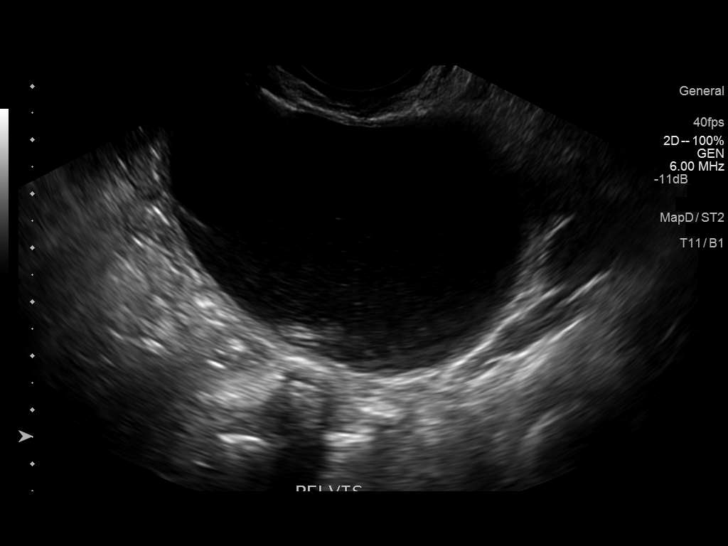
[im 73/88]
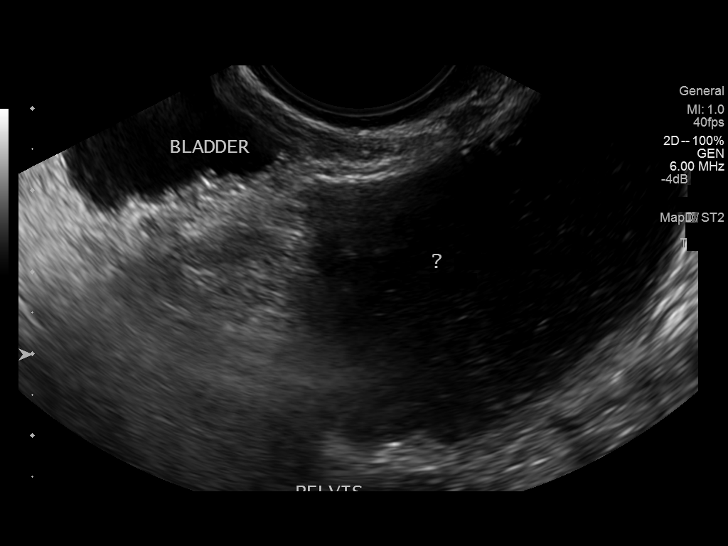
[im 80/88]
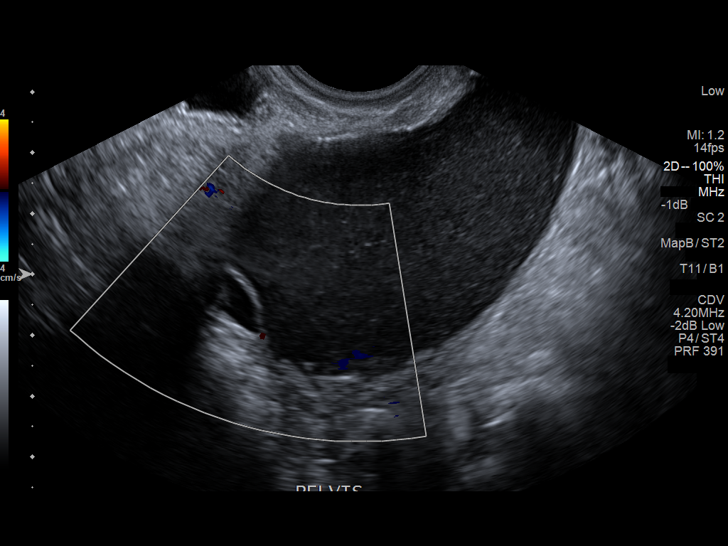
[im 88/88]
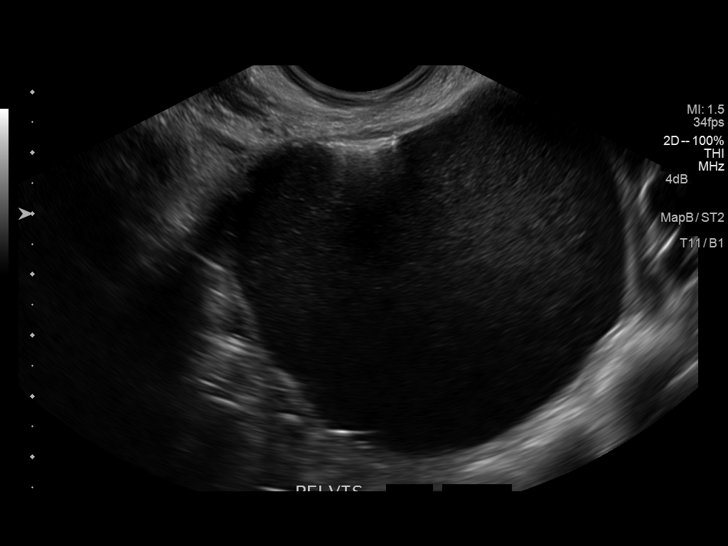

[13 of 25 positions shown; findings below may reference images not displayed]

FINDINGS: Uterus

Surgically absent.

Right ovary

Not visualized.

Left ovary

There is a 8 x 5.3 x 6.7 cm hypoechoic left adnexal mass with no
internal Doppler flow. There are low-level echoes within the ovarian
mass. There is no mural nodule. There are scattered mural
calcifications. There is no ovarian tissue identified.

Other findings

No free fluid.
IMPRESSION: There is a 8 x 5.3 x 6.7 cm hypoechoic left adnexal mass with no
internal Doppler flow or no mural nodule. There are scattered mural
calcifications. There is no ovarian tissue identified. Differential
diagnosis includes a cystic benign versus malignant ovarian neoplasm
versus peritoneal inclusion cyst versus lymphocele. Since these may
be difficult to assess completely with US, further evaluation of
simple-appearing cysts >7 cm with MRI or surgical evaluation is
recommended according to the Society of Radiologists in Ultrasound
4585 Consensus Conference Statement (Kltoum Cak et al. Management of
Asymptomatic Ovarian and other Adnexal Cysts Imaged at US: Society
of Radiologists in Ultrasound Consensus Conference Statement 4585.
Radiology [DATE]): [id number].).

## 2017-02-28 ENCOUNTER — Ambulatory Visit (INDEPENDENT_AMBULATORY_CARE_PROVIDER_SITE_OTHER): Payer: PPO

## 2017-02-28 DIAGNOSIS — Z23 Encounter for immunization: Secondary | ICD-10-CM

## 2017-06-17 ENCOUNTER — Telehealth: Payer: Self-pay | Admitting: Family Medicine

## 2017-06-17 NOTE — Telephone Encounter (Signed)
Called to schedule AWV with Nurse Health Advisor. Last AWV on 03/21/15 please schedule AWV with NHA any date  Jill Alexanders [phone]  Skype [email or web address]

## 2017-07-23 ENCOUNTER — Ambulatory Visit (INDEPENDENT_AMBULATORY_CARE_PROVIDER_SITE_OTHER): Payer: PPO | Admitting: Family Medicine

## 2017-07-23 ENCOUNTER — Encounter: Payer: Self-pay | Admitting: Family Medicine

## 2017-07-23 VITALS — BP 133/50 | HR 68 | Temp 98.2°F | Resp 16 | Ht 60.0 in | Wt 119.6 lb

## 2017-07-23 DIAGNOSIS — B001 Herpesviral vesicular dermatitis: Secondary | ICD-10-CM | POA: Diagnosis not present

## 2017-07-23 MED ORDER — VALACYCLOVIR HCL 1 G PO TABS: ORAL_TABLET | ORAL | 2 refills | Status: DC

## 2017-07-23 NOTE — Assessment & Plan Note (Signed)
Clinically consistent with nasolabial aspect cold core, L sided, improving now on Valtrex, without extension or spread or other secondary infection Seems less likely HZV (zoster) given localized aspect and different areas Triggered acute stressors, family member/loved ones pass recently Chronic recurrent, infrequent flares < 1 per year  No evidence of bacterial superinfection or complication.   Plan: 1. Finish current Valtrex round 1000mg  BID through 7 to 10 days, she is on day 5 now - improving - Refilled for future Valtrex - initial day 1 dose at 2000mg  (x 2 of the 1000mg  tabs) BID for 1 day, then if persistent can take reduced dose 1000mg  BID for 3-5 days, given that she is not starting therapy within 24 hours of onset. In future given refills she may take only the one day of high dose therapy if resolves it - Reviewed return precautions, she was at office yesterday late afternoon and requested SDA but we did not have any apt available, and she states was worried wanted it looked at, but soonest we could see her was today, she was advised may go to urgent care if worsening and we are unavailable in future Follow-up as planned if not improved

## 2017-07-23 NOTE — Patient Instructions (Addendum)
Thank you for coming to the office today.  1. You have a cold sore (most often caused by a herpes virus, HSV1) this is a very common virus that most people are exposed to, often the cold sore will come up in times of acute stress, usually upper lip and can spread. - Even though it is caused by a virus that remains in the body  Refilled Valtrex again continue with same treatment as you have been using 1000mg  twice daily for now up to 7 to 10 days to finish out rx  If the infection spread much deeper into nasal mucosa or tissue or involving other areas such as eye, then we would need to see you more immediately or can be seen promptly at the hospital ED  Please schedule a Follow-up Appointment to: Return in about 1 week (around 07/30/2017) for Annual Physical.    If you have any other questions or concerns, please feel free to call the office or send a message through [facility]. You may also schedule an earlier appointment if necessary.  Additionally, you may be receiving a survey about your experience at our office within a few days to 1 week by e-mail or mail. We value your feedback.  [name], DO [facility]

## 2017-07-23 NOTE — Progress Notes (Signed)
Subjective:    Patient ID: Kim Ayala, female    DOB: 1945/05/19, 72 y.o.   MRN: [id number]  Kim Ayala is a 72 y.o. female presenting on 07/23/2017 for Herpes Zoster vs Cold Sore  Patient presents for a same day appointment.  HPI   COLD SORE, Recurrent: - Last visit with me 04/2016, for initial visit for same problem, treated with Valtrex rx at that time for cold sore of upper lip, see prior notes for background information. - Interval update with improvement promptly after last treatment and seemed to resolve within few days, she did not fill empiric antibiotic rx Amoxicillin, no other issues, she was lost to follow-up until now >1 year later - Today patient reports new problem with recurrent sore now on Left nasolabial aspect similar to prior cold sores, but not involving the lip - She started Valtrex pill on Friday 3/8 1056m x 2 tabs BID for 1 day then reduced to 1 pill twice daily - She thinks trigger now was recent life stressors, lost family and friends passed, and this has taken a toll on her  - In past it had been >5-6 years between episodes, she is concerned now 2 episodes in 2 years - Still admits a localized tingling in this area - Denies any other skin rash, ulceration, redness, swelling, pain, drainage of pus, fever/chills   [facility] 07/24/2016 07/19/2015 03/21/2015  Decreased Interest 0 0 0  Down, Depressed, Hopeless 0 0 0  PHQ - 2 Score 0 0 0    Social History   Tobacco Use  . Smoking status: Never Smoker  . Smokeless tobacco: Never Used  Substance Use Topics  . Alcohol use: No    Alcohol/week: 0.0 oz  . Drug use: No    Review of Systems Per HPI unless specifically indicated above     Objective:    BP (!) 133/50   Pulse 68   Temp 98.2 F (36.8 C) (Oral)   Resp 16   Ht 5' (1.524 m)   Wt 119 lb 9.6 oz (54.3 kg)   BMI 23.36 kg/m   Wt Readings from Last 3 Encounters:  07/23/17 119 lb 9.6 oz (54.3 kg)  07/24/16 128 lb (58.1 kg)    04/26/16 135 lb (61.2 kg)    Physical Exam  Constitutional: She is oriented to person, place, and time. She appears well-developed and well-nourished. No distress.  Well-appearing, comfortable, cooperative  HENT:  Head: Normocephalic and atraumatic.  Mouth/Throat: Oropharynx is clear and moist.  Eyes: Conjunctivae are normal. Right eye exhibits no discharge. Left eye exhibits no discharge.  Cardiovascular: Normal rate.  Pulmonary/Chest: Effort normal.  Musculoskeletal: She exhibits no edema.  Neurological: She is alert and oriented to person, place, and time.  Skin: Skin is warm and dry. No rash noted. She is not diaphoretic. No erythema.  Left nare nasolabial with small < 1 cm area of scab, healing, resolving swelling localized, no erythema, no extension into nasal mucosa or elsewhere on face  Psychiatric: She has a normal mood and affect. Her behavior is normal.  Well groomed, good eye contact, normal speech and thoughts  Nursing note and vitals reviewed.  Results for orders placed or performed in visit on 07/24/16  COMPLETE METABOLIC PANEL WITH GFR  Result Value Ref Range   Sodium 143 135 - 146 mmol/L   Potassium 4.0 3.5 - 5.3 mmol/L   Chloride 108 98 - 110 mmol/L   CO2 24 20 - 31 mmol/L  Glucose, Bld 78 65 - 99 mg/dL   BUN 12 7 - 25 mg/dL   Creat 0.74 0.60 - 0.93 mg/dL   Total Bilirubin 0.4 0.2 - 1.2 mg/dL   Alkaline Phosphatase 90 33 - 130 U/L   AST 25 10 - 35 U/L   ALT 15 6 - 29 U/L   Total Protein 6.1 6.1 - 8.1 g/dL   Albumin 3.7 3.6 - 5.1 g/dL   Calcium 9.0 8.6 - 10.4 mg/dL   GFR, Est African American >89 >=60 mL/min   GFR, Est Non African American 82 >=60 mL/min  CBC with Differential  Result Value Ref Range   WBC 6.1 3.8 - 10.8 K/uL   RBC 4.53 3.80 - 5.10 MIL/uL   Hemoglobin 14.2 11.7 - 15.5 g/dL   HCT 43.2 35.0 - 45.0 %   MCV 95.4 80.0 - 100.0 fL   MCH 31.3 27.0 - 33.0 pg   MCHC 32.9 32.0 - 36.0 g/dL   RDW 13.8 11.0 - 15.0 %   Platelets 251 140 - 400 K/uL    MPV 10.3 7.5 - 12.5 fL   Neutro Abs 2,928 1,500 - 7,800 cells/uL   Lymphs Abs 2,562 850 - 3,900 cells/uL   Monocytes Absolute 427 200 - 950 cells/uL   Eosinophils Absolute 122 15 - 500 cells/uL   Basophils Absolute 61 0 - 200 cells/uL   Neutrophils Relative % 48 %   Lymphocytes Relative 42 %   Monocytes Relative 7 %   Eosinophils Relative 2 %   Basophils Relative 1 %   Smear Review Criteria for review not met   Lipid Profile  Result Value Ref Range   Cholesterol 173 <200 mg/dL   Triglycerides 123 <150 mg/dL   HDL 55 >50 mg/dL   Total CHOL/HDL Ratio 3.1 <5.0 Ratio   VLDL 25 <30 mg/dL   LDL Cholesterol 93 <100 mg/dL      Assessment & Plan:   Problem List Items Addressed This Visit    Recurrent cold sores - Primary    Clinically consistent with nasolabial aspect cold core, L sided, improving now on Valtrex, without extension or spread or other secondary infection Seems less likely HZV (zoster) given localized aspect and different areas Triggered acute stressors, family member/loved ones pass recently Chronic recurrent, infrequent flares < 1 per year  No evidence of bacterial superinfection or complication.   Plan: 1. Finish current Valtrex round 1066m BID through 7 to 10 days, she is on day 5 now - improving - Refilled for future Valtrex - initial day 1 dose at 20022m(x 2 of the 100031mabs) BID for 1 day, then if persistent can take reduced dose 1000m69mD for 3-5 days, given that she is not starting therapy within 24 hours of onset. In future given refills she may take only the one day of high dose therapy if resolves it - Reviewed return precautions, she was at office yesterday late afternoon and requested SDA but we did not have any apt available, and she states was worried wanted it looked at, but soonest we could see her was today, she was advised may go to urgent care if worsening and we are unavailable in future Follow-up as planned if not improved       Relevant  Medications   valACYclovir (VALTREX) 1000 MG tablet      Meds ordered this encounter  Medications  . valACYclovir (VALTREX) 1000 MG tablet    Sig: Take 2 tablets (2000mg54mice a day  for 1 day, and then may take 1 tablet twice a day for up to 3-7 days if still persistent.    Dispense:  20 tablet    Refill:  2    Keep new rx on file until requested by patient      Follow up plan: Return in about 1 week (around 07/30/2017) for Annual Physical.  She will return for physical and will request labs following day, due to insurance requirement  Discussed recommendation for AMW through her [employer] plan, and she will contact us or [facility] later to schedule AMW in future with [facility] 07/23/2017, 2:11 PM

## 2017-07-25 ENCOUNTER — Encounter: Payer: Self-pay | Admitting: Nurse Practitioner

## 2017-07-30 ENCOUNTER — Ambulatory Visit: Payer: Self-pay

## 2017-07-30 ENCOUNTER — Encounter: Payer: Self-pay | Admitting: Family Medicine

## 2017-07-30 ENCOUNTER — Ambulatory Visit (INDEPENDENT_AMBULATORY_CARE_PROVIDER_SITE_OTHER): Payer: PPO | Admitting: Family Medicine

## 2017-07-30 VITALS — BP 118/58 | Resp 16 | Ht 60.0 in | Wt 119.0 lb

## 2017-07-30 DIAGNOSIS — Z0000 Encounter for general adult medical examination without abnormal findings: Secondary | ICD-10-CM | POA: Diagnosis not present

## 2017-07-30 DIAGNOSIS — B001 Herpesviral vesicular dermatitis: Secondary | ICD-10-CM | POA: Diagnosis not present

## 2017-07-30 DIAGNOSIS — Z23 Encounter for immunization: Secondary | ICD-10-CM

## 2017-07-30 DIAGNOSIS — G4700 Insomnia, unspecified: Secondary | ICD-10-CM | POA: Insufficient documentation

## 2017-07-30 DIAGNOSIS — F5101 Primary insomnia: Secondary | ICD-10-CM

## 2017-07-30 NOTE — Assessment & Plan Note (Signed)
Resolved now, without complication Has valtrex if need repeat course in future

## 2017-07-30 NOTE — Patient Instructions (Addendum)
Thank you for coming to the office today.  1.  Pneumovax-23 vaccine today (2nd pneumonia vaccine), now you will be done forever for pneumonia vaccines on current schedule unless any new changes are developed  Continue current lifestyle and diet / exercise plan  Keep up the good work  Sleep Hygiene Recommendations to promote healthy sleep in all patients, especially if symptoms of insomnia are worsening. Due to the nature of sleep rhythms, if your body gets "out of rhythm", it may take some time before your sleep cycle can be "reset".  Please try to follow as many of the following tips as you can, usually there are only a few of these are the primary cause of the problem.  ?To reset your sleep rhythm, go to bed and get up at the same time every day ?Sleep only long enough to feel rested and then get out of bed ?Do not try to force yourself to sleep. If you can't sleep, get out of bed and try again later. ?Avoid naps during the day, unless excessively tired. The more sleeping during the day, then the less sleep your body needs at night.  ?Have coffee, tea, and other foods that have caffeine only in the morning ?Exercise several days a week, but not right before bed ?If you drink alcohol, prefer to have appropriate drink with one meal, but prefer to avoid alcohol in the evening, and bedtime ?If you smoke, avoid smoking, especially in the evening  ?Avoid watching TV or looking at phones, computers, or reading devices ("e-books") that give off light at least 30 minutes before bed. This artificial light sends "awake signals" to your brain and can make it harder to fall asleep. ?Make your bedroom a comfortable place where it is easy to fall asleep: ? Put up shades or special blackout curtains to block light from outside. ? Use a white noise machine to block noise. ? Keep the temperature cool. ?Try your best to solve or at least address your problems before you go to bed ?Use relaxation  techniques to manage stress. Ask your health care provider to suggest some techniques that may work well for you. These may include: ? Breathing exercises. ? Routines to release muscle tension. ? Visualizing peaceful scenes.  DUE for FASTING BLOOD WORK (no food or drink after midnight before the lab appointment, only water or coffee without cream/sugar on the morning of)  SCHEDULE "Lab Only" visit in the morning at the clinic for lab draw in 1-3  - Make sure Lab Only appointment is at about 1 week before your next appointment, so that results will be available  For Lab Results, once available within 2-3 days of blood draw, you can can log in to MyChart online to view your results and a brief explanation. Also, we can discuss results at next follow-up visit.  Follow-up 6 months for Annual Medicare Wellness w/ Tiffany and yearly with me for Annual Physical  Please schedule a Follow-up Appointment to: Return in about 1 year (around 07/31/2018) for Annual Physical.  If you have any other questions or concerns, please feel free to call the office or send a message through [place]. You may also schedule an earlier appointment if necessary.  Additionally, you may be receiving a survey about your experience at our office within a few days to 1 week by e-mail or mail. We value your feedback.  [name], DO [place]

## 2017-07-30 NOTE — Assessment & Plan Note (Signed)
Suspected secondary to multiple factors including some sleep hygiene, overactive mind at night, history of adjustment with loss of loved ones but without clinical depression or anxiety - Not interested in medicines - Failed Melatonin, tried already  Plan Handout given with sleep hygiene per AVS Emphasis on strategies to promote ability to fall back asleep Follow-up if not improved

## 2017-07-30 NOTE — Progress Notes (Signed)
Subjective:    Patient ID: Kim Ayala, female    DOB: February 26, 1946, 72 y.o.   MRN: [id number]  Kim Ayala is a 72 y.o. female presenting on 07/30/2017 for Annual Exam  HPI   Annual Exam and due for fasting Lab Only, next day  Lifestyle / Recent Elevated BP without dx HTN / Pre-HTN / Cholesterol Reports never dx HTN, no prior concerns with BP,  Last time in office 07/23/17 she had mild elevated SBP up to 133 on initial reading, attributed to anxiety at that time. She checked her BP at home with electric arm cuff that day and had better reading back in 110s to 120s. Normally she checks BP about 1 x a month, with normal readings 112-114 on average Current Meds - Never on meds Lifestyle: - Weight is stable now, she is overall down 10 lbs in past 1 year - Diet: Continues lifetime member of weight watchers, does eat 1 egg a day, limits dairy, drinks water - Exercise: Regular exercise, gym x 3 weekly - Taking MVI Denies CP, dyspnea, HA, edema, dizziness / lightheadedness  Insomnia - Reports chronic problem bothering her for past >5 years moved to [place], seems to be onset with symptoms when she lost her mother >8 years. Lost her husband in 2016, had been estranged for while, but married 20 years. She lives by herself. Watches TV before bed. Tried Melatonin OTC, doesn't work for her, says ineffective, and says tried other OTC meds for sleep, felt too groggy.  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 Admits mild tingling in upper lip now improved   Health Maintenance: Due for routine Hep C screening, she declines this test despite counseling on recommendation. She request reading on this and wants to reconsider for future.  Due for 2nd pneumonia vaccine >1 year after previous vaccine (s/p [id number] 07/2015), now to receive Pneumovax-23 today  Depression screen [facility] 2/9 07/30/2017  07/24/2016 07/19/2015  Decreased Interest 0 0 0  Down, Depressed, Hopeless 0 0 0  PHQ - 2 Score 0 0 0   No flowsheet data found.   Past Medical History:  Diagnosis Date  . 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 ABDOMINAL HYSTERECTOMY  1975   unsure of type  . APPENDECTOMY  1975  . CATARACT EXTRACTION  2009  . CHOLECYSTECTOMY  1998  . FRACTURE SURGERY  1996   bone  . LAPAROSCOPY Bilateral 08/30/2014   salpingo-oophorectomy and adhesiolysis  . RADICAL HYSTERECTOMY WITH TRANSPOSITION OF OVARIES     Social History   Socioeconomic History  . Marital status: Divorced    Spouse name: Not on file  . Number of children: Not on file  . Years of education: Not on file  . Highest education level: Not on file  Social Needs  . Financial resource strain: Not on file  . Food insecurity - worry: Not on file  . Food insecurity - inability: Not on file  . Transportation needs - medical: Not on file  . Transportation needs - non-medical: Not on file  Occupational History  . Not on file  Tobacco Use  . Smoking status: Never Smoker  . Smokeless tobacco: Never Used  Substance and Sexual Activity  . Alcohol use: No    Alcohol/week: 0.0 oz  . Drug use: No  . Sexual activity: Not Currently  Other Topics  Concern  . Not on file  Social History Narrative  . Not on file   Family History  Problem Relation Age of Onset  . Heart disease Mother    Current Outpatient Medications on File Prior to Visit  Medication Sig  . Acetaminophen 500 MG coapsule   . Calcium 150 MG TABS Take 150 mg by mouth daily.  . Multiple Vitamins-Minerals (MULTIVITAMIN WITH MINERALS) tablet Take 1 tablet by mouth daily.  . valACYclovir (VALTREX) 1000 MG tablet Take 2 tablets (2050m) twice a day for 1 day, and then may take 1 tablet twice a day for up to 3-7 days if still persistent.  . vitamin C (ASCORBIC ACID) 500 MG tablet   . vitamin E 100 UNIT  capsule Take by mouth daily.   No current facility-administered medications on file prior to visit.     Review of Systems  Constitutional: Negative for activity change, appetite change, chills, diaphoresis, fatigue and fever.  HENT: Negative for congestion and hearing loss.   Eyes: Negative for visual disturbance.  Respiratory: Negative for apnea, cough, chest tightness, shortness of breath and wheezing.   Cardiovascular: Negative for chest pain, palpitations and leg swelling.  Gastrointestinal: Negative for abdominal pain, anal bleeding, blood in stool, constipation, diarrhea, nausea and vomiting.  Endocrine: Negative for cold intolerance.  Genitourinary: Negative for dysuria, frequency and hematuria.  Musculoskeletal: Negative for arthralgias, back pain and neck pain.  Skin: Negative for rash.  Allergic/Immunologic: Negative for environmental allergies.  Neurological: Negative for dizziness, weakness, light-headedness, numbness and headaches.  Hematological: Negative for adenopathy.  Psychiatric/Behavioral: Positive for sleep disturbance. Negative for agitation, behavioral problems, decreased concentration, dysphoric mood, self-injury and suicidal ideas. The patient is not nervous/anxious.    Per HPI unless specifically indicated above     Objective:    BP (!) 118/58   Resp 16   Ht 5' (1.524 m)   Wt 119 lb (54 kg)   BMI 23.24 kg/m   Wt Readings from Last 3 Encounters:  07/30/17 119 lb (54 kg)  07/23/17 119 lb 9.6 oz (54.3 kg)  07/24/16 128 lb (58.1 kg)    Physical Exam  Constitutional: She is oriented to person, place, and time. She appears well-developed and well-nourished. No distress.  Well-appearing, comfortable, cooperative  HENT:  Head: Normocephalic and atraumatic.  Mouth/Throat: Oropharynx is clear and moist.  Eyes: Conjunctivae and EOM are normal. Pupils are equal, round, and reactive to light. Right eye exhibits no discharge. Left eye exhibits no discharge.    Neck: Normal range of motion. Neck supple. No thyromegaly present.  Cardiovascular: Normal rate, regular rhythm, normal heart sounds and intact distal pulses.  No murmur heard. Pulmonary/Chest: Effort normal and breath sounds normal. No respiratory distress. She has no wheezes. She has no rales.  Abdominal: Soft. Bowel sounds are normal. She exhibits no distension and no mass. There is no tenderness.  Musculoskeletal: Normal range of motion. She exhibits no edema or tenderness.  Upper / Lower Extremities: - Normal muscle tone, strength bilateral upper extremities 5/5, lower extremities 5/5  Lymphadenopathy:    She has no cervical adenopathy.  Neurological: She is alert and oriented to person, place, and time.  Distal sensation intact to light touch all extremities  Skin: Skin is warm and dry. No rash noted. She is not diaphoretic. No erythema.  Cold sore scab completely resolved at left nare edge nasolabial fold  Psychiatric: She has a normal mood and affect. Her behavior is normal.  Well groomed, good eye  contact, normal speech and thoughts  Nursing note and vitals reviewed.  Results for orders placed or performed in visit on 07/24/16  COMPLETE METABOLIC PANEL WITH GFR  Result Value Ref Range   Sodium 143 135 - 146 mmol/L   Potassium 4.0 3.5 - 5.3 mmol/L   Chloride 108 98 - 110 mmol/L   CO2 24 20 - 31 mmol/L   Glucose, Bld 78 65 - 99 mg/dL   BUN 12 7 - 25 mg/dL   Creat 0.74 0.60 - 0.93 mg/dL   Total Bilirubin 0.4 0.2 - 1.2 mg/dL   Alkaline Phosphatase 90 33 - 130 U/L   AST 25 10 - 35 U/L   ALT 15 6 - 29 U/L   Total Protein 6.1 6.1 - 8.1 g/dL   Albumin 3.7 3.6 - 5.1 g/dL   Calcium 9.0 8.6 - 10.4 mg/dL   GFR, Est African American >89 >=60 mL/min   GFR, Est Non African American 82 >=60 mL/min  CBC with Differential  Result Value Ref Range   WBC 6.1 3.8 - 10.8 K/uL   RBC 4.53 3.80 - 5.10 MIL/uL   Hemoglobin 14.2 11.7 - 15.5 g/dL   HCT 43.2 35.0 - 45.0 %   MCV 95.4 80.0 -  100.0 fL   MCH 31.3 27.0 - 33.0 pg   MCHC 32.9 32.0 - 36.0 g/dL   RDW 13.8 11.0 - 15.0 %   Platelets 251 140 - 400 K/uL   MPV 10.3 7.5 - 12.5 fL   Neutro Abs 2,928 1,500 - 7,800 cells/uL   Lymphs Abs 2,562 850 - 3,900 cells/uL   Monocytes Absolute 427 200 - 950 cells/uL   Eosinophils Absolute 122 15 - 500 cells/uL   Basophils Absolute 61 0 - 200 cells/uL   Neutrophils Relative % 48 %   Lymphocytes Relative 42 %   Monocytes Relative 7 %   Eosinophils Relative 2 %   Basophils Relative 1 %   Smear Review Criteria for review not met   Lipid Profile  Result Value Ref Range   Cholesterol 173 <200 mg/dL   Triglycerides 123 <150 mg/dL   HDL 55 >50 mg/dL   Total CHOL/HDL Ratio 3.1 <5.0 Ratio   VLDL 25 <30 mg/dL   LDL Cholesterol 93 <100 mg/dL      Assessment & Plan:   Problem List Items Addressed This Visit    Insomnia    Suspected secondary to multiple factors including some sleep hygiene, overactive mind at night, history of adjustment with loss of loved ones but without clinical depression or anxiety - Not interested in medicines - Failed Melatonin, tried already  Plan Handout given with sleep hygiene per AVS Emphasis on strategies to promote ability to fall back asleep Follow-up if not improved      Recurrent cold sores    Resolved now, without complication Has valtrex if need repeat course in future       Other Visit Diagnoses    Annual physical exam    -  Primary Reviewed HM, declined mammogram and dexa, will reconsider these Declined Hep C , see above Ordered future labs for tomorrow, will review and contact patient Encouraged improve healthy lifestyle diet and exercise maintain weight    Relevant Orders   Hemoglobin A1c   CBC with Differential/Platelet   COMPLETE METABOLIC PANEL WITH GFR   Lipid panel   Need for 23-polyvalent pneumococcal polysaccharide vaccine       Relevant Orders   Pneumococcal polysaccharide vaccine 23-valent greater  than or equal to  2yo subcutaneous/IM (Completed)      No orders of the defined types were placed in this encounter.   Follow up plan: Return in about 1 year (around 07/31/2018) for Annual Physical.   Return in 1 day for fasting lab only, then will return in 1 year.  She should schedule AMW with nurse health advisor anytime within 6 months for yearly eval.  [name], DO [facility] 07/30/2017, 11:19 PM

## 2017-07-31 ENCOUNTER — Other Ambulatory Visit: Payer: Self-pay

## 2017-07-31 DIAGNOSIS — Z0000 Encounter for general adult medical examination without abnormal findings: Secondary | ICD-10-CM | POA: Diagnosis not present

## 2017-08-01 LAB — COMPLETE METABOLIC PANEL WITH GFR
AG RATIO: 1.7 (calc) (ref 1.0–2.5)
ALT: 14 U/L (ref 6–29)
AST: 21 U/L (ref 10–35)
Albumin: 4 g/dL (ref 3.6–5.1)
Alkaline phosphatase (APISO): 79 U/L (ref 33–130)
BILIRUBIN TOTAL: 0.5 mg/dL (ref 0.2–1.2)
BUN: 15 mg/dL (ref 7–25)
CHLORIDE: 105 mmol/L (ref 98–110)
CO2: 28 mmol/L (ref 20–32)
Calcium: 9.1 mg/dL (ref 8.6–10.4)
Creat: 0.78 mg/dL (ref 0.60–0.93)
GFR, Est African American: 88 mL/min/{1.73_m2} (ref >=60)
GFR, Est Non African American: 76 mL/min/{1.73_m2} (ref >=60)
GLOBULIN: 2.4 g/dL (ref 1.9–3.7)
Glucose, Bld: 74 mg/dL (ref 65–99)
POTASSIUM: 4 mmol/L (ref 3.5–5.3)
SODIUM: 140 mmol/L (ref 135–146)
TOTAL PROTEIN: 6.4 g/dL (ref 6.1–8.1)

## 2017-08-01 LAB — CBC WITH DIFFERENTIAL/PLATELET
BASOS ABS: 39 {cells}/uL (ref 0–200)
Basophils Relative: 0.5 %
EOS PCT: 2.8 %
Eosinophils Absolute: 218 cells/uL (ref 15–500)
HCT: 41.1 % (ref 35.0–45.0)
HEMOGLOBIN: 14 g/dL (ref 11.7–15.5)
Lymphs Abs: 2262 cells/uL (ref 850–3900)
MCH: 32.1 pg (ref 27.0–33.0)
MCHC: 34.1 g/dL (ref 32.0–36.0)
MCV: 94.3 fL (ref 80.0–100.0)
MONOS PCT: 8 %
MPV: 10.2 fL (ref 7.5–12.5)
NEUTROS ABS: 4657 {cells}/uL (ref 1500–7800)
Neutrophils Relative %: 59.7 %
PLATELETS: 256 10*3/uL (ref 140–400)
RBC: 4.36 10*6/uL (ref 3.80–5.10)
RDW: 12.4 % (ref 11.0–15.0)
TOTAL LYMPHOCYTE: 29 %
WBC mixed population: 624 cells/uL (ref 200–950)
WBC: 7.8 10*3/uL (ref 3.8–10.8)

## 2017-08-01 LAB — HEMOGLOBIN A1C
HEMOGLOBIN A1C: 5.1 %{Hb} (ref <5.7)
Mean Plasma Glucose: 100 (calc)
eAG (mmol/L): 5.5 (calc)

## 2017-08-01 LAB — LIPID PANEL
CHOL/HDL RATIO: 2.8 (calc) (ref <5.0)
CHOLESTEROL: 182 mg/dL (ref <200)
HDL: 64 mg/dL (ref >50)
LDL Cholesterol (Calc): 98 mg/dL (calc)
NON-HDL CHOLESTEROL (CALC): 118 mg/dL (ref <130)
TRIGLYCERIDES: 105 mg/dL (ref <150)

## 2017-10-03 ENCOUNTER — Encounter: Payer: Self-pay | Admitting: Nurse Practitioner

## 2017-10-03 ENCOUNTER — Other Ambulatory Visit: Payer: Self-pay

## 2017-10-03 ENCOUNTER — Ambulatory Visit (INDEPENDENT_AMBULATORY_CARE_PROVIDER_SITE_OTHER): Payer: PPO | Admitting: Nurse Practitioner

## 2017-10-03 VITALS — BP 118/42 | HR 63 | Temp 97.7°F | Ht 60.0 in | Wt 119.0 lb

## 2017-10-03 DIAGNOSIS — B354 Tinea corporis: Secondary | ICD-10-CM

## 2017-10-03 MED ORDER — CLOTRIMAZOLE 1 % EX CREA: 1.0000 "application " | TOPICAL_CREAM | Freq: Two times a day (BID) | CUTANEOUS | 0 refills | Status: DC

## 2017-10-03 NOTE — Patient Instructions (Addendum)
Rene Gonsoulin,   Thank you for coming in to clinic today.  1. Your rash appears fungal.  2. START clotrimazole ointment twice daily for 7 - 10 days.  Please schedule a follow-up appointment with Cassell Smiles, AGNP. Return 5-7 days if symptoms worsen or fail to improve.  If you have any other questions or concerns, please feel free to call the clinic or send a message through [place]. You may also schedule an earlier appointment if necessary.  You will receive a survey after today's visit either digitally by e-mail or paper by [employer]. Your experiences and feedback matter to [place].  Please respond so we know how we are doing as we provide care for you.   Cassell Smiles, DNP, AGNP-BC Adult Gerontology Nurse Practitioner [place]

## 2017-10-03 NOTE — Progress Notes (Signed)
Subjective:    Patient ID: Kim Ayala, female    DOB: [birth year], 72 y.o.   MRN: [id number]  Kim Ayala is a 72 y.o. female presenting on 10/03/2017 for Rash (redness, itching rash around the neck x 2-3 weeks. Pt states it started out on the left side and spread to the right. )   HPI Rash Pt reports initial rash approximately 3 months ago.  She notes it improved, but has returned 2-3 weeks ago around neck with new spread to right side of neck. Has not used anything for rash.   - Noted initially after wearing a new piece of costume jewelry, but has not worn this since initial use approximately 3 months ago.  Social History   Tobacco Use  . Smoking status: Never Smoker  . Smokeless tobacco: Never Used  Substance Use Topics  . Alcohol use: No    Alcohol/week: 0.0 oz  . Drug use: No    Review of Systems Per HPI unless specifically indicated above     Objective:    BP (!) 118/42 (BP Location: Right Arm, Patient Position: Sitting, Cuff Size: Normal)   Pulse 63   Temp 97.7 F (36.5 C) (Oral)   Ht 5' (1.524 m)   Wt 119 lb (54 kg)   BMI 23.24 kg/m   Wt Readings from Last 3 Encounters:  10/03/17 119 lb (54 kg)  07/30/17 119 lb (54 kg)  07/23/17 119 lb 9.6 oz (54.3 kg)    Physical Exam  Constitutional: She is oriented to person, place, and time. She appears well-developed and well-nourished. No distress.  HENT:  Head: Normocephalic and atraumatic.  Cardiovascular: Normal rate, regular rhythm, S1 normal, S2 normal, normal heart sounds and intact distal pulses.  Pulmonary/Chest: Effort normal and breath sounds normal. No respiratory distress.  Neurological: She is alert and oriented to person, place, and time.  Skin: Skin is warm and dry.  Erythematous macules in linear fashion wrapping around neck at level of necklace.    Psychiatric: She has a normal mood and affect. Her behavior is normal. Judgment and thought content normal.  Vitals reviewed.    Results for orders  placed or performed in visit on 07/31/17  Lipid panel  Result Value Ref Range   Cholesterol 182 <200 mg/dL   HDL 64 >50 mg/dL   Triglycerides 105 <150 mg/dL   LDL Cholesterol (Calc) 98 mg/dL (calc)   Total CHOL/HDL Ratio 2.8 <5.0 (calc)   Non-HDL Cholesterol (Calc) 118 <130 mg/dL (calc)  COMPLETE METABOLIC PANEL WITH GFR  Result Value Ref Range   Glucose, Bld 74 65 - 99 mg/dL   BUN 15 7 - 25 mg/dL   Creat 0.78 0.60 - 0.93 mg/dL   GFR, Est Non African American 76 > OR = 60 mL/min/1.23m2   GFR, Est African American 88 > OR = 60 mL/min/1.22m2   BUN/Creatinine Ratio NOT APPLICABLE 6 - 22 (calc)   Sodium 140 135 - 146 mmol/L   Potassium 4.0 3.5 - 5.3 mmol/L   Chloride 105 98 - 110 mmol/L   CO2 28 20 - 32 mmol/L   Calcium 9.1 8.6 - 10.4 mg/dL   Total Protein 6.4 6.1 - 8.1 g/dL   Albumin 4.0 3.6 - 5.1 g/dL   Globulin 2.4 1.9 - 3.7 g/dL (calc)   AG Ratio 1.7 1.0 - 2.5 (calc)   Total Bilirubin 0.5 0.2 - 1.2 mg/dL   Alkaline phosphatase (APISO) 79 33 - 130 U/L   AST 21 10 -  35 U/L   ALT 14 6 - 29 U/L  CBC with Differential/Platelet  Result Value Ref Range   WBC 7.8 3.8 - 10.8 Thousand/uL   RBC 4.36 3.80 - 5.10 Million/uL   Hemoglobin 14.0 11.7 - 15.5 g/dL   HCT 41.1 35.0 - 45.0 %   MCV 94.3 80.0 - 100.0 fL   MCH 32.1 27.0 - 33.0 pg   MCHC 34.1 32.0 - 36.0 g/dL   RDW 12.4 11.0 - 15.0 %   Platelets 256 140 - 400 Thousand/uL   MPV 10.2 7.5 - 12.5 fL   Neutro Abs 4,657 1,500 - 7,800 cells/uL   Lymphs Abs 2,262 850 - 3,900 cells/uL   WBC mixed population 624 200 - 950 cells/uL   Eosinophils Absolute 218 15 - 500 cells/uL   Basophils Absolute 39 0 - 200 cells/uL   Neutrophils Relative % 59.7 %   Total Lymphocyte 29.0 %   Monocytes Relative 8.0 %   Eosinophils Relative 2.8 %   Basophils Relative 0.5 %  Hemoglobin A1c  Result Value Ref Range   Hgb A1c MFr Bld 5.1 <5.7 % of total Hgb   Mean Plasma Glucose 100 (calc)   eAG (mmol/L) 5.5 (calc)      Assessment & Plan:   Problem  List Items Addressed This Visit    None    Visit Diagnoses    Tinea corporis    -  Primary   Relevant Medications   clotrimazole (LOTRIMIN) 1 % cream    Acute worsening of rash that was previously stable.  Not resolving with avoidance of possible trigger.  Likely is fungal rash as it appears c/w tinea corporis.   START using clotrimazole cream twice daily for 7-10 days.   Avoid scratching area to prevent further spread Followup as needed if symptoms worsen or persist.   Meds ordered this encounter  Medications  . clotrimazole (LOTRIMIN) 1 % cream    Sig: Apply 1 application topically 2 (two) times daily.    Dispense:  30 g    Refill:  0    Order Specific Question:   Supervising Provider    Answer:   Olin Hauser [[id number]]    Follow up plan: Return 5-7 days if symptoms worsen or fail to improve.  Cassell Smiles, DNP, AGPCNP-BC Adult Gerontology Primary Care Nurse Practitioner [facility] 10/11/2017, 10:03 PM

## 2017-10-11 ENCOUNTER — Encounter: Payer: Self-pay | Admitting: Nurse Practitioner

## 2018-02-10 ENCOUNTER — Telehealth: Payer: Self-pay | Admitting: Family Medicine
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
## 2018-02-11 ENCOUNTER — Ambulatory Visit: Payer: PPO

## 2018-03-12 ENCOUNTER — Ambulatory Visit (INDEPENDENT_AMBULATORY_CARE_PROVIDER_SITE_OTHER): Payer: PPO | Admitting: Family Medicine

## 2018-03-12 ENCOUNTER — Encounter: Payer: Self-pay | Admitting: Family Medicine

## 2018-03-12 VITALS — BP 114/37 | HR 63 | Temp 98.4°F | Resp 16 | Ht 60.0 in | Wt 119.0 lb

## 2018-03-12 DIAGNOSIS — B354 Tinea corporis: Secondary | ICD-10-CM | POA: Diagnosis not present

## 2018-03-12 DIAGNOSIS — L219 Seborrheic dermatitis, unspecified: Secondary | ICD-10-CM

## 2018-03-12 MED ORDER — CLOTRIMAZOLE-BETAMETHASONE 1-0.05 % EX CREA: TOPICAL_CREAM | CUTANEOUS | 1 refills | Status: DC

## 2018-03-12 NOTE — Progress Notes (Signed)
Subjective:    Patient ID: Kim Ayala, female    DOB: [birth year], 72 y.o.   MRN: [id number]  Athleen Feltner is a 72 y.o. female presenting on 03/12/2018 for Rash   HPI  Follow-Up Rash / Tinea Corporis / Dermatitis - Last visit at our office on 10/03/17, for initial visit for rash diagnosed with Tinea Corporis, treated with Clotrimazole 1% cream, see prior notes for background information. - Interval update with trial on topical anti fungal cream, with mixed results, improved results with reduced "redness" and some improved scaly appearance but it did not ever resolve - Today patient reports still has same problem with recurrent rash on back of neck. It seems to be localized only back of neck and usually one side or the other, L vs R, no other rash on body or back and now seems worse with rash on both sides of neck. - Still admits itching - She is unaware of any new triggers, topical products, detergents, fabrics, jewelry - Admits new problem few days ago with R side of face with slightly red raised rash Denies any pain, ulcer, drainage, joint pain or other skin involvement, swelling  Health Maintenance: Due for Flu Shot, declines today despite counseling on benefits   Depression screen [facility] 2/9 03/12/2018 07/30/2017 07/24/2016  Decreased Interest 0 0 0  Down, Depressed, Hopeless 0 0 0  PHQ - 2 Score 0 0 0    Social History   Tobacco Use  . Smoking status: Never Smoker  . Smokeless tobacco: Never Used  Substance Use Topics  . Alcohol use: No    Alcohol/week: 0.0 standard drinks  . Drug use: No    Review of Systems Per HPI unless specifically indicated above     Objective:    BP (!) 114/37   Pulse 63   Temp 98.4 F (36.9 C) (Oral)   Resp 16   Ht 5' (1.524 m)   Wt 119 lb (54 kg)   BMI 23.24 kg/m   Wt Readings from Last 3 Encounters:  03/12/18 119 lb (54 kg)  10/03/17 119 lb (54 kg)  07/30/17 119 lb (54 kg)    Physical Exam  Constitutional: She is oriented to  person, place, and time. She appears well-developed and well-nourished. No distress.  Well-appearing, comfortable, cooperative  HENT:  Head: Normocephalic and atraumatic.  Mouth/Throat: Oropharynx is clear and moist.  Eyes: Conjunctivae are normal. Right eye exhibits no discharge. Left eye exhibits no discharge.  Cardiovascular: Normal rate.  Pulmonary/Chest: Effort normal.  Musculoskeletal: She exhibits no edema.  Neurological: She is alert and oriented to person, place, and time.  Skin: Skin is warm and dry. Rash (localized areas of neck R>L and posterior neck with slightly erythematous patches with some dry flaking skin as well, no ulceration) noted. She is not diaphoretic. No erythema.  Psychiatric: Her behavior is normal.  Well groomed, good eye contact, normal speech and thoughts  Nursing note and vitals reviewed.    R side of neck    Back of Neck     Results for orders placed or performed in visit on 07/31/17  Lipid panel  Result Value Ref Range   Cholesterol 182 <200 mg/dL   HDL 64 >50 mg/dL   Triglycerides 105 <150 mg/dL   LDL Cholesterol (Calc) 98 mg/dL (calc)   Total CHOL/HDL Ratio 2.8 <5.0 (calc)   Non-HDL Cholesterol (Calc) 118 <130 mg/dL (calc)  COMPLETE METABOLIC PANEL WITH GFR  Result Value Ref Range   Glucose,  Bld 74 65 - 99 mg/dL   BUN 15 7 - 25 mg/dL   Creat 0.78 0.60 - 0.93 mg/dL   GFR, Est Non African American 76 > OR = 60 mL/min/1.33m2   GFR, Est African American 88 > OR = 60 mL/min/1.53m2   BUN/Creatinine Ratio NOT APPLICABLE 6 - 22 (calc)   Sodium 140 135 - 146 mmol/L   Potassium 4.0 3.5 - 5.3 mmol/L   Chloride 105 98 - 110 mmol/L   CO2 28 20 - 32 mmol/L   Calcium 9.1 8.6 - 10.4 mg/dL   Total Protein 6.4 6.1 - 8.1 g/dL   Albumin 4.0 3.6 - 5.1 g/dL   Globulin 2.4 1.9 - 3.7 g/dL (calc)   AG Ratio 1.7 1.0 - 2.5 (calc)   Total Bilirubin 0.5 0.2 - 1.2 mg/dL   Alkaline phosphatase (APISO) 79 33 - 130 U/L   AST 21 10 - 35 U/L   ALT 14 6 - 29 U/L    CBC with Differential/Platelet  Result Value Ref Range   WBC 7.8 3.8 - 10.8 Thousand/uL   RBC 4.36 3.80 - 5.10 Million/uL   Hemoglobin 14.0 11.7 - 15.5 g/dL   HCT 41.1 35.0 - 45.0 %   MCV 94.3 80.0 - 100.0 fL   MCH 32.1 27.0 - 33.0 pg   MCHC 34.1 32.0 - 36.0 g/dL   RDW 12.4 11.0 - 15.0 %   Platelets 256 140 - 400 Thousand/uL   MPV 10.2 7.5 - 12.5 fL   Neutro Abs 4,657 1,500 - 7,800 cells/uL   Lymphs Abs 2,262 850 - 3,900 cells/uL   WBC mixed population 624 200 - 950 cells/uL   Eosinophils Absolute 218 15 - 500 cells/uL   Basophils Absolute 39 0 - 200 cells/uL   Neutrophils Relative % 59.7 %   Total Lymphocyte 29.0 %   Monocytes Relative 8.0 %   Eosinophils Relative 2.8 %   Basophils Relative 0.5 %  Hemoglobin A1c  Result Value Ref Range   Hgb A1c MFr Bld 5.1 <5.7 % of total Hgb   Mean Plasma Glucose 100 (calc)   eAG (mmol/L) 5.5 (calc)      Assessment & Plan:   Problem List Items Addressed This Visit    None    Visit Diagnoses    Seborrheic dermatitis of scalp    -  Primary   Relevant Medications   clotrimazole-betamethasone (LOTRISONE) cream   Tinea corporis       Relevant Medications   clotrimazole-betamethasone (LOTRISONE) cream      Clinically consistent with dermatitis, w/ scalp involvement seems more seborrheic, area on R side of neck has an appearance of more tinea with irregular borders, had some partial response to clotrimazole - no sign of infection secondary - no obvious trigger for contact, but cannot rule out given isolated location  Plan Start double therapy with Clotrimazole-betamethasone cream as instructed If not improving or worsen or new concerns 2nd opinion Dermatology she can schedule or we can place referral to [facility] ordered this encounter  Medications  . clotrimazole-betamethasone (LOTRISONE) cream    Sig: Apply 1-2 times a day for worsening flare dry skin dermatitis of toes/feet, may re-use daily up to 1 week as  needed.    Dispense:  45 g    Refill:  1     Follow up plan: Return in about 2 weeks (around 03/26/2018), or if symptoms worsen or fail to improve, for rash dermatitis.  [name],  DO [facility] 03/12/2018, 2:01 PM

## 2018-03-12 NOTE — Patient Instructions (Addendum)
Thank you for coming to the office today.  Start with Clotrimazole-Betamethasone cream with anti fungal and NOW added steroid component for dermatitis and irritation of skin.  Use as instructed for up to 1-2 weeks  If not improved we can consider oral prednisone and possibly refer to Dermatology  [facility]   [place], [place] [address] Hours: 8AM-5PM Phone: [phone]  Sarina Ser, MD  Please schedule a Follow-up Appointment to: Return in about 2 weeks (around 03/26/2018), or if symptoms worsen or fail to improve, for rash dermatitis.  If you have any other questions or concerns, please feel free to call the office or send a message through [place]. You may also schedule an earlier appointment if necessary.  Additionally, you may be receiving a survey about your experience at our office within a few days to 1 week by e-mail or mail. We value your feedback.  [name], DO [place]

## 2018-03-20 DIAGNOSIS — L820 Inflamed seborrheic keratosis: Secondary | ICD-10-CM | POA: Diagnosis not present

## 2018-03-20 DIAGNOSIS — R21 Rash and other nonspecific skin eruption: Secondary | ICD-10-CM | POA: Diagnosis not present

## 2018-03-20 DIAGNOSIS — L900 Lichen sclerosus et atrophicus: Secondary | ICD-10-CM | POA: Diagnosis not present

## 2018-03-20 DIAGNOSIS — L99 Other disorders of skin and subcutaneous tissue in diseases classified elsewhere: Secondary | ICD-10-CM | POA: Diagnosis not present

## 2018-03-20 DIAGNOSIS — L821 Other seborrheic keratosis: Secondary | ICD-10-CM | POA: Diagnosis not present

## 2018-06-09 DIAGNOSIS — L820 Inflamed seborrheic keratosis: Secondary | ICD-10-CM | POA: Diagnosis not present

## 2018-06-09 DIAGNOSIS — L659 Nonscarring hair loss, unspecified: Secondary | ICD-10-CM | POA: Diagnosis not present

## 2018-06-09 DIAGNOSIS — L700 Acne vulgaris: Secondary | ICD-10-CM | POA: Diagnosis not present

## 2018-06-09 DIAGNOSIS — D1800 Hemangioma unspecified site: Secondary | ICD-10-CM | POA: Diagnosis not present

## 2018-06-09 DIAGNOSIS — L900 Lichen sclerosus et atrophicus: Secondary | ICD-10-CM | POA: Diagnosis not present

## 2018-06-09 DIAGNOSIS — L99 Other disorders of skin and subcutaneous tissue in diseases classified elsewhere: Secondary | ICD-10-CM | POA: Diagnosis not present

## 2018-06-09 DIAGNOSIS — R21 Rash and other nonspecific skin eruption: Secondary | ICD-10-CM | POA: Diagnosis not present

## 2018-06-09 DIAGNOSIS — L821 Other seborrheic keratosis: Secondary | ICD-10-CM | POA: Diagnosis not present

## 2018-07-23 ENCOUNTER — Encounter: Payer: PPO | Admitting: Family Medicine

## 2018-07-24 ENCOUNTER — Other Ambulatory Visit: Payer: Self-pay | Admitting: Family Medicine

## 2018-07-24 ENCOUNTER — Telehealth: Payer: Self-pay | Admitting: Family Medicine

## 2018-07-24 DIAGNOSIS — Z0000 Encounter for general adult medical examination without abnormal findings: Secondary | ICD-10-CM

## 2018-07-24 NOTE — Telephone Encounter (Signed)
Pt will be in for physical fon 3/25.  She would like to do labs the week before.  Her call back number is [phone]

## 2018-07-24 NOTE — Telephone Encounter (Signed)
Ordered labs for 3/17  Kim Ayala, [name] 07/24/2018, 5:06 PM

## 2018-07-29 ENCOUNTER — Other Ambulatory Visit: Payer: Self-pay

## 2018-07-29 ENCOUNTER — Other Ambulatory Visit: Payer: PPO

## 2018-07-29 DIAGNOSIS — Z0000 Encounter for general adult medical examination without abnormal findings: Secondary | ICD-10-CM

## 2018-07-30 LAB — COMPLETE METABOLIC PANEL WITH GFR
AG Ratio: 2.3 (calc) (ref 1.0–2.5)
ALBUMIN MSPROF: 4.5 g/dL (ref 3.6–5.1)
ALKALINE PHOSPHATASE (APISO): 65 U/L (ref 37–153)
ALT: 17 U/L (ref 6–29)
AST: 25 U/L (ref 10–35)
BUN: 18 mg/dL (ref 7–25)
CALCIUM: 9.4 mg/dL (ref 8.6–10.4)
CO2: 26 mmol/L (ref 20–32)
CREATININE: 0.8 mg/dL (ref 0.60–0.93)
Chloride: 103 mmol/L (ref 98–110)
GFR, EST NON AFRICAN AMERICAN: 73 mL/min/{1.73_m2} (ref >=60)
GFR, Est African American: 85 mL/min/{1.73_m2} (ref >=60)
GLOBULIN: 2 g/dL (ref 1.9–3.7)
GLUCOSE: 77 mg/dL (ref 65–99)
Potassium: 4 mmol/L (ref 3.5–5.3)
Sodium: 139 mmol/L (ref 135–146)
TOTAL PROTEIN: 6.5 g/dL (ref 6.1–8.1)
Total Bilirubin: 0.5 mg/dL (ref 0.2–1.2)

## 2018-07-30 LAB — LIPID PANEL
CHOLESTEROL: 223 mg/dL — AB (ref <200)
HDL: 75 mg/dL (ref >=50)
LDL Cholesterol (Calc): 128 mg/dL (calc) — ABNORMAL HIGH
Non-HDL Cholesterol (Calc): 148 mg/dL (calc) — ABNORMAL HIGH (ref <130)
Total CHOL/HDL Ratio: 3 (calc) (ref <5.0)
Triglycerides: 95 mg/dL (ref <150)

## 2018-07-30 LAB — CBC WITH DIFFERENTIAL/PLATELET
ABSOLUTE MONOCYTES: 609 {cells}/uL (ref 200–950)
BASOS PCT: 0.3 %
Basophils Absolute: 26 cells/uL (ref 0–200)
EOS ABS: 122 {cells}/uL (ref 15–500)
Eosinophils Relative: 1.4 %
HEMATOCRIT: 43 % (ref 35.0–45.0)
Hemoglobin: 15 g/dL (ref 11.7–15.5)
LYMPHS ABS: 2941 {cells}/uL (ref 850–3900)
MCH: 33.3 pg — AB (ref 27.0–33.0)
MCHC: 34.9 g/dL (ref 32.0–36.0)
MCV: 95.6 fL (ref 80.0–100.0)
MPV: 10.5 fL (ref 7.5–12.5)
Monocytes Relative: 7 %
NEUTROS PCT: 57.5 %
Neutro Abs: 5003 cells/uL (ref 1500–7800)
Platelets: 279 10*3/uL (ref 140–400)
RBC: 4.5 10*6/uL (ref 3.80–5.10)
RDW: 12.3 % (ref 11.0–15.0)
Total Lymphocyte: 33.8 %
WBC: 8.7 10*3/uL (ref 3.8–10.8)

## 2018-07-30 LAB — HEMOGLOBIN A1C
Hgb A1c MFr Bld: 5.3 % of total Hgb (ref <5.7)
MEAN PLASMA GLUCOSE: 105 (calc)
eAG (mmol/L): 5.8 (calc)

## 2018-08-04 ENCOUNTER — Telehealth: Payer: Self-pay | Admitting: Family Medicine

## 2018-08-04 NOTE — Telephone Encounter (Signed)
Pt called requesting labs results [phone]

## 2018-08-06 ENCOUNTER — Encounter: Payer: PPO | Admitting: Family Medicine

## 2018-08-15 ENCOUNTER — Telehealth: Payer: Self-pay | Admitting: Family Medicine

## 2018-08-15 NOTE — Telephone Encounter (Signed)
Patient advised and would like a copy of lab work mailed printed and mailed it to the patient.

## 2018-08-15 NOTE — Telephone Encounter (Signed)
I never received this phone call.  See most recent telephone call  [name], [facility] 08/15/2018, 12:32 PM

## 2018-08-15 NOTE — Telephone Encounter (Signed)
Patient had lab work done for physical 03/17 wanted to know for test result , advised patient that she can scheduled and follow up virtual appointment if she prefers.

## 2018-08-15 NOTE — Telephone Encounter (Signed)
PT  Called for lab results. Pt  Call back # is  [phone]

## 2018-08-15 NOTE — Telephone Encounter (Signed)
It looks like her lab work was completed but her physical was likely cancelled and re-scheduled due to coronavirus.  I see she tried to call us last week as well but I did not receive that message.  Patient has 2 options  1. For no charge or bill to insurance - We could call her with results only - Asa Lente - the lab result note is already attached to her labs from 3/17 - this has explanation of her test results to review  2. Or if she wants to hear results and get any medical advice, she may schedule a Virtual Telephone visit with me anytime. We could discuss cholesterol medicine further this way if she was interested.  [name], [facility] 08/15/2018, 12:35 PM

## 2018-09-18 ENCOUNTER — Ambulatory Visit (INDEPENDENT_AMBULATORY_CARE_PROVIDER_SITE_OTHER): Payer: PPO | Admitting: Family Medicine

## 2018-09-18 ENCOUNTER — Encounter: Payer: Self-pay | Admitting: Family Medicine

## 2018-09-18 ENCOUNTER — Other Ambulatory Visit: Payer: Self-pay

## 2018-09-18 DIAGNOSIS — F4323 Adjustment disorder with mixed anxiety and depressed mood: Secondary | ICD-10-CM | POA: Diagnosis not present

## 2018-09-18 DIAGNOSIS — F5101 Primary insomnia: Secondary | ICD-10-CM

## 2018-09-18 MED ORDER — DIAZEPAM 5 MG PO TABS: 5.0000 mg | ORAL_TABLET | Freq: Every evening | ORAL | 0 refills | Status: DC | PRN

## 2018-09-18 NOTE — Progress Notes (Signed)
Virtual Visit via Telephone The purpose of this virtual visit is to provide medical care while limiting exposure to the novel coronavirus (COVID19) for both patient and office staff.  Consent was obtained for phone visit:  Yes.   Answered questions that patient had about telehealth interaction:  Yes.   I discussed the limitations, risks, security and privacy concerns of performing an evaluation and management service by telephone. I also discussed with the patient that there may be a patient responsible charge related to this service. The patient expressed understanding and agreed to proceed.  Patient Location: Home Provider Location: [facility])  ---------------------------------------------------------------------- Chief Complaint  Patient presents with  . Anxiety  . Insomnia    S: Reviewed CMA documentation. I have called patient and gathered additional HPI as follows:  Acute Adjustment Disorder depression / anxiety, Insomnia Recent history with acute adjustment to current situation with coronavirus pandemic, worsening anxiety/depression acutely due to one of her friend's husband passed, and her friend survived it, she has had difficulty sleeping worse now. - In past she only had successful improvement on generic Diazepam in the past short term, years ago for insomnia, with prior PCP, she has known history of primary insomnia, has failed other medications and melatonin in past. Now it is flared up more. She has tolerated the Diazepam with good in past tolerated well, never on it long term  Denies any high risk travel to areas of current concern for COVID19. Denies any known or suspected exposure to person with or possibly with COVID19.  Denies any fevers, chills, sweats, body ache, cough, shortness of breath, sinus pain or pressure, headache, abdominal pain, diarrhea  Past Medical History:  Diagnosis Date  . 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 Smoking status: Never Smoker  . Smokeless tobacco: Never Used  Substance Use Topics  . Alcohol use: No    Alcohol/week: 0.0 standard drinks  . Drug use: No    Current Outpatient Medications:  .  Acetaminophen 500 MG coapsule, , Disp: , Rfl:  .  Calcium 150 MG TABS, Take 150 mg by mouth daily., Disp: , Rfl:  .  clotrimazole (LOTRIMIN) 1 % cream, Apply 1 application topically 2 (two) times daily., Disp: 30 g, Rfl: 0 .  clotrimazole-betamethasone (LOTRISONE) cream, Apply 1-2 times a day for worsening flare dry skin dermatitis of toes/feet, may re-use daily up to 1 week as needed., Disp: 45 g, Rfl: 1 .  Multiple Vitamins-Minerals (MULTIVITAMIN WITH MINERALS) tablet, Take 1 tablet by mouth daily., Disp: , Rfl:  .  vitamin C (ASCORBIC ACID) 500 MG tablet, , Disp: , Rfl:  .  vitamin E 100 UNIT capsule, Take by mouth daily., Disp: , Rfl:  .  valACYclovir (VALTREX) 1000 MG tablet, Take 2 tablets (2000mg ) twice a day for 1 day, and then may take 1 tablet twice a day for up to 3-7 days if still persistent. (Patient not taking: Reported on 09/18/2018), Disp: 20 tablet, Rfl: 2  Depression screen [facility] 2/9 09/18/2018 03/12/2018 07/30/2017  Decreased Interest 3 0 0  Down, Depressed, Hopeless 3 0 0  PHQ - 2 Score 6 0 0  Altered sleeping 3 - -  Tired, decreased energy 3 - -  Change in appetite 3 - -  Feeling bad or failure about yourself  0 - -  Trouble concentrating 0 - -  Moving slowly or fidgety/restless 0 - -  Suicidal thoughts 0 - -  PHQ-9  Score 15 - -  Difficult doing work/chores Not difficult at all - -    GAD 7 : Generalized Anxiety Score 09/18/2018  Nervous, Anxious, on Edge 3  Control/stop worrying 3  Worry too much - different things 3  Trouble relaxing 3  Restless 0  Easily annoyed or irritable 0  Afraid - awful might happen 3  Total GAD 7 Score 15  Anxiety Difficulty Not difficult at all     -------------------------------------------------------------------------- O: No physical exam performed due to remote telephone encounter.  Lab results reviewed.  Recent Results (from the past 2160 hour(s))  Lipid panel     Status: Abnormal   Collection Time: 07/29/18 10:02 AM  Result Value Ref Range   Cholesterol 223 (H) <200 mg/dL   HDL 75 > OR = 50 mg/dL   Triglycerides 95 <150 mg/dL   LDL Cholesterol (Calc) 128 (H) mg/dL (calc)    Comment: Reference range: <100 . Desirable range <100 mg/dL for primary prevention;   <70 mg/dL for patients with CHD or diabetic patients  with > or = 2 CHD risk factors. Mar[place] Kitchen LDL-C is now calculated using the Martin-Hopkins  calculation, which is a validated novel method providing  better accuracy than the Friedewald equation in the  estimation of LDL-C.  Cresenciano Genre et al. Annamaria Helling. 6387;564(33): 2061-2068  (http://education.[employer].com/faq/FAQ164)    Total CHOL/HDL Ratio 3.0 <5.0 (calc)   Non-HDL Cholesterol (Calc) 148 (H) <130 mg/dL (calc)    Comment: For patients with diabetes plus 1 major ASCVD risk  factor, treating to a non-HDL-C goal of <100 mg/dL  (LDL-C of <70 mg/dL) is considered a therapeutic  option.   COMPLETE METABOLIC PANEL WITH GFR     Status: None   Collection Time: 07/29/18 10:02 AM  Result Value Ref Range   Glucose, Bld 77 65 - 99 mg/dL    Comment: .            Fasting reference interval .    BUN 18 7 - 25 mg/dL   Creat 0.80 0.60 - 0.93 mg/dL    Comment: For patients >18 years of age, the reference limit for Creatinine is approximately 13% higher for people identified as African-American. .    GFR, Est Non African American 73 > OR = 60 mL/min/1.106m2   GFR, Est African American 85 > OR = 60 mL/min/1.29m2   BUN/Creatinine Ratio NOT APPLICABLE 6 - 22 (calc)   Sodium 139 135 - 146 mmol/L   Potassium 4.0 3.5 - 5.3 mmol/L   Chloride 103 98 - 110 mmol/L   CO2 26 20 - 32 mmol/L   Calcium 9.4 8.6 - 10.4 mg/dL    Total Protein 6.5 6.1 - 8.1 g/dL   Albumin 4.5 3.6 - 5.1 g/dL   Globulin 2.0 1.9 - 3.7 g/dL (calc)   AG Ratio 2.3 1.0 - 2.5 (calc)   Total Bilirubin 0.5 0.2 - 1.2 mg/dL   Alkaline phosphatase (APISO) 65 37 - 153 U/L   AST 25 10 - 35 U/L   ALT 17 6 - 29 U/L  CBC with Differential/Platelet     Status: Abnormal   Collection Time: 07/29/18 10:02 AM  Result Value Ref Range   WBC 8.7 3.8 - 10.8 Thousand/uL   RBC 4.50 3.80 - 5.10 Million/uL   Hemoglobin 15.0 11.7 - 15.5 g/dL   HCT 43.0 35.0 - 45.0 %   MCV 95.6 80.0 - 100.0 fL   MCH 33.3 (H) 27.0 - 33.0 pg   MCHC 34.9  32.0 - 36.0 g/dL   RDW 12.3 11.0 - 15.0 %   Platelets 279 140 - 400 Thousand/uL   MPV 10.5 7.5 - 12.5 fL   Neutro Abs 5,003 1,500 - 7,800 cells/uL   Lymphs Abs 2,941 850 - 3,900 cells/uL   Absolute Monocytes 609 200 - 950 cells/uL   Eosinophils Absolute 122 15 - 500 cells/uL   Basophils Absolute 26 0 - 200 cells/uL   Neutrophils Relative % 57.5 %   Total Lymphocyte 33.8 %   Monocytes Relative 7.0 %   Eosinophils Relative 1.4 %   Basophils Relative 0.3 %  Hemoglobin A1c     Status: None   Collection Time: 07/29/18 10:02 AM  Result Value Ref Range   Hgb A1c MFr Bld 5.3 <5.7 % of total Hgb    Comment: For the purpose of screening for the presence of diabetes: . <5.7%       Consistent with the absence of diabetes 5.7-6.4%    Consistent with increased risk for diabetes             (prediabetes) > or =6.5%  Consistent with diabetes . This assay result is consistent with a decreased risk of diabetes. . Currently, no consensus exists regarding use of hemoglobin A1c for diagnosis of diabetes in children. . According to American Diabetes Association (ADA) guidelines, hemoglobin A1c <7.0% represents optimal control in non-pregnant diabetic patients. Different metrics may apply to specific patient populations.  Standards of Medical Care in Diabetes(ADA). .    Mean Plasma Glucose 105 (calc)   eAG (mmol/L) 5.8 (calc)     -------------------------------------------------------------------------- A&P:  Problem List Items Addressed This Visit    Insomnia - Primary    Other Visit Diagnoses    Acute adjustment disorder with mixed anxiety and depressed mood         Clinically with acute adjustment due to life stressor with coronavirus pandemic, anxiety/depression now worsening existing primary insomnia - Currently not on medication therapy - Failed meds in past with limited benefit - Only improved on temporary diazepam in past short term only  Anticipate if she is able to rest and sleep better, likely will allow her to manage her adjustment disorder based on her report today  Plan - Discussed med options for insomnia, agree to short term trial of Diazepam 5mg  nightly PRN anxiety/insomnia, discussed alternative options such as Trazodone, Ambien if need in future or referral to Psychiatry vs Therapist if warranted  No orders of the defined types were placed in this encounter.   Follow-up: - Return in 1-3 months for insomnia/adjustment  Patient verbalizes understanding with the above medical recommendations including the limitation of remote medical advice.  Specific follow-up and call-back criteria were given for patient to follow-up or seek medical care more urgently if needed.  - Time spent in direct consultation with patient on phone: 13 minutes   [name], [facility] 09/18/2018, 8:46 AM

## 2018-09-18 NOTE — Patient Instructions (Addendum)
AVS info given on phone, no mychart access

## 2018-11-04 ENCOUNTER — Other Ambulatory Visit: Payer: Self-pay

## 2018-11-04 ENCOUNTER — Ambulatory Visit (INDEPENDENT_AMBULATORY_CARE_PROVIDER_SITE_OTHER): Payer: PPO | Admitting: Family Medicine

## 2018-11-04 ENCOUNTER — Encounter: Payer: Self-pay | Admitting: Family Medicine

## 2018-11-04 VITALS — BP 128/44 | HR 64 | Temp 98.7°F | Resp 16 | Ht 60.0 in | Wt 121.8 lb

## 2018-11-04 DIAGNOSIS — E7800 Pure hypercholesterolemia, unspecified: Secondary | ICD-10-CM | POA: Diagnosis not present

## 2018-11-04 DIAGNOSIS — Z0000 Encounter for general adult medical examination without abnormal findings: Secondary | ICD-10-CM

## 2018-11-04 NOTE — Patient Instructions (Addendum)
Thank you for coming to the office today.  Overall lab results are excellent.  Lipid Panel     Component Value Date/Time   CHOL 223 (H) 07/29/2018 1002   CHOL 162 09/23/2015 0822   TRIG 95 07/29/2018 1002   HDL 75 07/29/2018 1002   HDL 59 09/23/2015 0822   CHOLHDL 3.0 07/29/2018 1002   VLDL 25 07/24/2016 0001   LDLCALC 128 (H) 07/29/2018 1002     Recent Labs    07/29/18 1002  HGBA1C 5.3    As discussed, your cardiovascular risk score is around 10% risk over 10 years of heart attack/stroke - we can reconsider at any point starting one of those statin medicines  Keep improving lifestyle and diet  Strengthening for bones.  DUE for FASTING BLOOD WORK (no food or drink after midnight before the lab appointment, only water or coffee without cream/sugar on the morning of)  SCHEDULE "Lab Only" visit in the morning at the clinic for lab draw in 1 YEAR  - Make sure Lab Only appointment is at about 1 week before your next appointment, so that results will be available  For Lab Results, once available within 2-3 days of blood draw, you can can log in to MyChart online to view your results and a brief explanation. Also, we can discuss results at next follow-up visit.   Please schedule a Follow-up Appointment to: Return in about 1 year (around 11/04/2019) for Annual Physical.  If you have any other questions or concerns, please feel free to call the office or send a message through [place]. You may also schedule an earlier appointment if necessary.  Additionally, you may be receiving a survey about your experience at our office within a few days to 1 week by e-mail or mail. We value your feedback.  [name], DO [place]

## 2018-11-04 NOTE — Progress Notes (Signed)
Subjective:    Patient ID: Kim Ayala, female    DOB: August 21, 1945, 73 y.o.   MRN: [id number]  Kim Ayala is a 73 y.o. female presenting on 11/04/2018 for Annual Exam   HPI   Here for Annual Physical and Lab Review.  Lifestyle / HYPERLIPIDEMIA: - Reports no concerns. Last lipid panel mild elevated LDL, elevated HDL - Not taking any cholesterol medication. Lifestyle - Diet: improving   Health Maintenance: DEXA - last done 2012, she is asking about hormone therapy for bone strength. She plans to keep doing strengthening. She is not interested in repeating a DEXA scan. She has friends who have tried Prolia and other treatments and they have had fractures.  Breast Cancer Screening - declines routine screening mammogram, last done 2012, declines despite counseling on benefits of cancer prevention.  Depression screen [facility] 2/9 11/04/2018 09/18/2018 03/12/2018  Decreased Interest 1 3 0  Down, Depressed, Hopeless 0 3 0  PHQ - 2 Score 1 6 0  Altered sleeping - 3 -  Tired, decreased energy - 3 -  Change in appetite - 3 -  Feeling bad or failure about yourself  - 0 -  Trouble concentrating - 0 -  Moving slowly or fidgety/restless - 0 -  Suicidal thoughts - 0 -  PHQ-9 Score - 15 -  Difficult doing work/chores - Not difficult at all -   GAD 7 : Generalized Anxiety Score 11/04/2018 09/18/2018  Nervous, Anxious, on Edge 1 3  Control/stop worrying 1 3  Worry too much - different things 1 3  Trouble relaxing 1 3  Restless 0 0  Easily annoyed or irritable 1 0  Afraid - awful might happen 1 3  Total GAD 7 Score 6 15  Anxiety Difficulty Not difficult at all Not difficult at all      Past Medical History:  Diagnosis Date  . 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 ABDOMINAL HYSTERECTOMY  1975   unsure of type  . APPENDECTOMY  1975  . CATARACT EXTRACTION  2009  . CHOLECYSTECTOMY  1998  . FRACTURE SURGERY  1996   bone  .  LAPAROSCOPY Bilateral 08/30/2014   salpingo-oophorectomy and adhesiolysis  . RADICAL HYSTERECTOMY WITH TRANSPOSITION OF OVARIES     Social History   Socioeconomic History  . Marital status: Divorced    Spouse name: Not on file  . Number of children: Not on file  . Years of education: Not on file  . Highest education level: Not on file  Occupational History  . Not on file  Social Needs  . Financial resource strain: Not on file  . Food insecurity    Worry: Not on file    Inability: Not on file  . Transportation needs    Medical: Not on file    Non-medical: Not on file  Tobacco Use  . Smoking status: Never Smoker  . Smokeless tobacco: Never Used  Substance and Sexual Activity  . Alcohol use: No    Alcohol/week: 0.0 standard drinks  . Drug use: No  . Sexual activity: Not Currently  Lifestyle  . Physical activity    Days per week: Not on file    Minutes per session: Not on file  . Stress: Not on file  Relationships  . Social Herbalist on phone: Not on file    Gets together: Not on file    Attends religious service: Not on file  Active member of club or organization: Not on file    Attends meetings of clubs or organizations: Not on file    Relationship status: Not on file  . Intimate partner violence    Fear of current or ex partner: Not on file    Emotionally abused: Not on file    Physically abused: Not on file    Forced sexual activity: Not on file  Other Topics Concern  . Not on file  Social History Narrative  . Not on file   Family History  Problem Relation Age of Onset  . Heart disease Mother    Current Outpatient Medications on File Prior to Visit  Medication Sig  . Acetaminophen 500 MG coapsule   . Calcium 150 MG TABS Take 150 mg by mouth daily.  . clotrimazole (LOTRIMIN) 1 % cream Apply 1 application topically 2 (two) times daily.  . clotrimazole-betamethasone (LOTRISONE) cream Apply 1-2 times a day for worsening flare dry skin dermatitis  of toes/feet, may re-use daily up to 1 week as needed.  . Multiple Vitamins-Minerals (MULTIVITAMIN WITH MINERALS) tablet Take 1 tablet by mouth daily.  . vitamin C (ASCORBIC ACID) 500 MG tablet   . vitamin E 100 UNIT capsule Take by mouth daily.  . diazepam (VALIUM) 5 MG tablet Take 1 tablet (5 mg total) by mouth at bedtime as needed for anxiety (sleep). (Patient not taking: Reported on 11/04/2018)  . valACYclovir (VALTREX) 1000 MG tablet Take 2 tablets (2000mg ) twice a day for 1 day, and then may take 1 tablet twice a day for up to 3-7 days if still persistent. (Patient not taking: Reported on 09/18/2018)   No current facility-administered medications on file prior to visit.     Review of Systems  Constitutional: Negative for activity change, appetite change, chills, diaphoresis, fatigue and fever.  HENT: Negative for congestion and hearing loss.   Eyes: Negative for visual disturbance.  Respiratory: Negative for cough, chest tightness, shortness of breath and wheezing.   Cardiovascular: Negative for chest pain, palpitations and leg swelling.  Gastrointestinal: Negative for abdominal pain, constipation, diarrhea, nausea and vomiting.  Genitourinary: Negative for dysuria, frequency and hematuria.  Musculoskeletal: Negative for arthralgias and neck pain.  Skin: Negative for rash.  Neurological: Negative for dizziness, weakness, light-headedness, numbness and headaches.  Hematological: Negative for adenopathy.  Psychiatric/Behavioral: Negative for behavioral problems, dysphoric mood and sleep disturbance.   Per HPI unless specifically indicated above      Objective:    BP (!) 128/44   Pulse 64   Temp 98.7 F (37.1 C) (Oral)   Resp 16   Ht 5' (1.524 m)   Wt 121 lb 12.8 oz (55.2 kg)   SpO2 98%   BMI 23.79 kg/m   Wt Readings from Last 3 Encounters:  11/04/18 121 lb 12.8 oz (55.2 kg)  03/12/18 119 lb (54 kg)  10/03/17 119 lb (54 kg)    Physical Exam Vitals signs and nursing note  reviewed.  Constitutional:      General: She is not in acute distress.    Appearance: She is well-developed. She is not diaphoretic.     Comments: Well-appearing, comfortable, cooperative  HENT:     Head: Normocephalic and atraumatic.  Eyes:     General:        Right eye: No discharge.        Left eye: No discharge.     Conjunctiva/sclera: Conjunctivae normal.     Pupils: Pupils are equal, round, and reactive  to light.  Neck:     Musculoskeletal: Normal range of motion and neck supple.     Thyroid: No thyromegaly.  Cardiovascular:     Rate and Rhythm: Normal rate and regular rhythm.     Heart sounds: Normal heart sounds. No murmur.  Pulmonary:     Effort: Pulmonary effort is normal. No respiratory distress.     Breath sounds: Normal breath sounds. No wheezing or rales.  Abdominal:     General: Bowel sounds are normal. There is no distension.     Palpations: Abdomen is soft. There is no mass.     Tenderness: There is no abdominal tenderness.  Musculoskeletal: Normal range of motion.        General: No tenderness.     Comments: Upper / Lower Extremities: - Normal muscle tone, strength bilateral upper extremities 5/5, lower extremities 5/5  Lymphadenopathy:     Cervical: No cervical adenopathy.  Skin:    General: Skin is warm and dry.     Findings: No erythema or rash.  Neurological:     Mental Status: She is alert and oriented to person, place, and time.     Comments: Distal sensation intact to light touch all extremities  Psychiatric:        Behavior: Behavior normal.     Comments: Well groomed, good eye contact, normal speech and thoughts    Results for orders placed or performed in visit on 07/29/18  Lipid panel  Result Value Ref Range   Cholesterol 223 (H) <200 mg/dL   HDL 75 > OR = 50 mg/dL   Triglycerides 95 <150 mg/dL   LDL Cholesterol (Calc) 128 (H) mg/dL (calc)   Total CHOL/HDL Ratio 3.0 <5.0 (calc)   Non-HDL Cholesterol (Calc) 148 (H) <130 mg/dL (calc)   COMPLETE METABOLIC PANEL WITH GFR  Result Value Ref Range   Glucose, Bld 77 65 - 99 mg/dL   BUN 18 7 - 25 mg/dL   Creat 0.80 0.60 - 0.93 mg/dL   GFR, Est Non African American 73 > OR = 60 mL/min/1.30m2   GFR, Est African American 85 > OR = 60 mL/min/1.51m2   BUN/Creatinine Ratio NOT APPLICABLE 6 - 22 (calc)   Sodium 139 135 - 146 mmol/L   Potassium 4.0 3.5 - 5.3 mmol/L   Chloride 103 98 - 110 mmol/L   CO2 26 20 - 32 mmol/L   Calcium 9.4 8.6 - 10.4 mg/dL   Total Protein 6.5 6.1 - 8.1 g/dL   Albumin 4.5 3.6 - 5.1 g/dL   Globulin 2.0 1.9 - 3.7 g/dL (calc)   AG Ratio 2.3 1.0 - 2.5 (calc)   Total Bilirubin 0.5 0.2 - 1.2 mg/dL   Alkaline phosphatase (APISO) 65 37 - 153 U/L   AST 25 10 - 35 U/L   ALT 17 6 - 29 U/L  CBC with Differential/Platelet  Result Value Ref Range   WBC 8.7 3.8 - 10.8 Thousand/uL   RBC 4.50 3.80 - 5.10 Million/uL   Hemoglobin 15.0 11.7 - 15.5 g/dL   HCT 43.0 35.0 - 45.0 %   MCV 95.6 80.0 - 100.0 fL   MCH 33.3 (H) 27.0 - 33.0 pg   MCHC 34.9 32.0 - 36.0 g/dL   RDW 12.3 11.0 - 15.0 %   Platelets 279 140 - 400 Thousand/uL   MPV 10.5 7.5 - 12.5 fL   Neutro Abs 5,003 1,500 - 7,800 cells/uL   Lymphs Abs 2,941 850 - 3,900 cells/uL   Absolute Monocytes  609 200 - 950 cells/uL   Eosinophils Absolute 122 15 - 500 cells/uL   Basophils Absolute 26 0 - 200 cells/uL   Neutrophils Relative % 57.5 %   Total Lymphocyte 33.8 %   Monocytes Relative 7.0 %   Eosinophils Relative 1.4 %   Basophils Relative 0.3 %  Hemoglobin A1c  Result Value Ref Range   Hgb A1c MFr Bld 5.3 <5.7 % of total Hgb   Mean Plasma Glucose 105 (calc)   eAG (mmol/L) 5.8 (calc)      Assessment & Plan:   Problem List Items Addressed This Visit    Pure hypercholesterolemia    Other Visit Diagnoses    Annual physical exam    -  Primary      Updated Health Maintenance information - Declined Mammogram - Declines Statin, counseling on benefits, ASCVD score is near 10%, still recommended for  statin, will reconsider in future Reviewed recent lab results with patient Encouraged improvement to lifestyle with diet and exercise   No orders of the defined types were placed in this encounter.   Follow up plan: Return in about 1 year (around 11/04/2019) for Annual Physical.  [name], DO [facility] 11/04/2018, 3:21 PM

## 2018-11-11 DIAGNOSIS — H26493 Other secondary cataract, bilateral: Secondary | ICD-10-CM | POA: Diagnosis not present

## 2018-12-09 ENCOUNTER — Other Ambulatory Visit: Payer: Self-pay | Admitting: Family Medicine

## 2018-12-09 DIAGNOSIS — F4323 Adjustment disorder with mixed anxiety and depressed mood: Secondary | ICD-10-CM

## 2018-12-09 DIAGNOSIS — F5101 Primary insomnia: Secondary | ICD-10-CM

## 2018-12-09 NOTE — Telephone Encounter (Signed)
Pt wants to know if she needs to be seen before she can get another refill sent in   diazepam (VALIUM) 5 MG tablet [[id number]]

## 2018-12-10 MED ORDER — DIAZEPAM 5 MG PO TABS: 5.0000 mg | ORAL_TABLET | Freq: Every evening | ORAL | 0 refills | Status: DC | PRN

## 2018-12-10 NOTE — Telephone Encounter (Signed)
I have sent refill Diazepam 5mg  nightly AS NEEDED for insomnia. For 30 pills.  Patient contacted our office after hours yesterday 7/28 as well.  Last time we discussed this medication was on 09/18/18 for virtual visit.  At that time, I asked her to follow up with me on insomnia within 3 months.  Please let her know that this would be last rx until she follows up within next 1-3 months to discuss meds further.  [name], [facility] 12/10/2018, 12:06 PM

## 2018-12-11 NOTE — Telephone Encounter (Signed)
Patient advised.

## 2019-10-19 ENCOUNTER — Encounter: Payer: Self-pay | Admitting: Family Medicine

## 2019-10-19 ENCOUNTER — Other Ambulatory Visit: Payer: Self-pay | Admitting: Family Medicine

## 2019-10-19 ENCOUNTER — Other Ambulatory Visit: Payer: Self-pay

## 2019-10-19 ENCOUNTER — Ambulatory Visit (INDEPENDENT_AMBULATORY_CARE_PROVIDER_SITE_OTHER): Payer: PPO | Admitting: Family Medicine

## 2019-10-19 VITALS — BP 113/41 | HR 61 | Temp 97.1°F | Resp 16 | Ht 60.0 in | Wt 121.0 lb

## 2019-10-19 DIAGNOSIS — G500 Trigeminal neuralgia: Secondary | ICD-10-CM | POA: Diagnosis not present

## 2019-10-19 DIAGNOSIS — E7800 Pure hypercholesterolemia, unspecified: Secondary | ICD-10-CM

## 2019-10-19 DIAGNOSIS — F5101 Primary insomnia: Secondary | ICD-10-CM

## 2019-10-19 DIAGNOSIS — R519 Headache, unspecified: Secondary | ICD-10-CM

## 2019-10-19 DIAGNOSIS — Z0000 Encounter for general adult medical examination without abnormal findings: Secondary | ICD-10-CM

## 2019-10-19 DIAGNOSIS — R7309 Other abnormal glucose: Secondary | ICD-10-CM

## 2019-10-19 MED ORDER — DIAZEPAM 5 MG PO TABS: 5.0000 mg | ORAL_TABLET | Freq: Every evening | ORAL | 0 refills | Status: DC | PRN

## 2019-10-19 MED ORDER — GABAPENTIN 100 MG PO CAPS: ORAL_CAPSULE | ORAL | 1 refills | Status: DC

## 2019-10-19 NOTE — Progress Notes (Addendum)
Subjective:    Patient ID: Kim Ayala, female    DOB: 15-Apr-1946, 74 y.o.   MRN: [id number]  Kim Ayala is a 74 y.o. female presenting on 10/19/2019 for Migraine (past 2 days )   HPI   Headache Reports symptoms onset 2.5 days ago with sharp stabbing "ice pick" stabbing pain moderate to severe pain constant episodes frequently throughout the day. She has not been able to rest well while having this headache. Tried Tylenol Extra Str 500mg  x 1 dose slight reduction in pain. History Migraines years ago. Previously back in 1980s, triggered by cigar smoke. Now no new triggers History of lost bridge in dental area, awaiting on implant Denies any nausea vomiting  Adjustment Disorder depression / anxiety, Insomnia Prior history of anxiety / adjustment to COVID pandemic and insomnia Taking Diazepam (valium) as needed only, rarely took had 2 rx in past 1 year, with good results PRN for insomnia. Request re order today help with her headache has not slept well   Depression screen [facility] 2/9 11/04/2018 09/18/2018 03/12/2018  Decreased Interest 1 3 0  Down, Depressed, Hopeless 0 3 0  PHQ - 2 Score 1 6 0  Altered sleeping - 3 -  Tired, decreased energy - 3 -  Change in appetite - 3 -  Feeling bad or failure about yourself  - 0 -  Trouble concentrating - 0 -  Moving slowly or fidgety/restless - 0 -  Suicidal thoughts - 0 -  PHQ-9 Score - 15 -  Difficult doing work/chores - Not difficult at all -    Social History   Tobacco Use  . Smoking status: Never Smoker  . Smokeless tobacco: Never Used  Substance Use Topics  . Alcohol use: No    Alcohol/week: 0.0 standard drinks  . Drug use: No    Review of Systems Per HPI unless specifically indicated above     Objective:    BP (!) 113/41   Pulse 61   Temp (!) 97.1 F (36.2 C) (Temporal)   Resp 16   Ht 5' (1.524 m)   Wt 121 lb (54.9 kg)   SpO2 100%   BMI 23.63 kg/m   Wt Readings from Last 3 Encounters:  10/19/19 121 lb (54.9 kg)    11/04/18 121 lb 12.8 oz (55.2 kg)  03/12/18 119 lb (54 kg)    Physical Exam Vitals and nursing note reviewed.  Constitutional:      General: She is not in acute distress.    Appearance: She is well-developed. She is not diaphoretic.     Comments: Well-appearing, comfortable, cooperative  HENT:     Head: Normocephalic and atraumatic.     Right Ear: Tympanic membrane, ear canal and external ear normal. There is no impacted cerumen.     Left Ear: Tympanic membrane, ear canal and external ear normal. There is no impacted cerumen.  Eyes:     General:        Right eye: No discharge.        Left eye: No discharge.     Conjunctiva/sclera: Conjunctivae normal.  Neck:     Thyroid: No thyromegaly.     Vascular: No carotid bruit.  Cardiovascular:     Rate and Rhythm: Normal rate and regular rhythm.     Heart sounds: Normal heart sounds. No murmur.  Pulmonary:     Effort: Pulmonary effort is normal. No respiratory distress.     Breath sounds: Normal breath sounds. No wheezing or rales.  Musculoskeletal:        General: Normal range of motion.     Cervical back: Normal range of motion and neck supple.     Comments: Strength in all extremity upper lower 5/5  Lymphadenopathy:     Cervical: No cervical adenopathy.  Skin:    General: Skin is warm and dry.     Findings: No erythema or rash.  Neurological:     General: No focal deficit present.     Mental Status: She is alert and oriented to person, place, and time. Mental status is at baseline.     Cranial Nerves: No cranial nerve deficit.     Sensory: No sensory deficit.     Comments: Episodic sharp headache pains during exam on L side  Psychiatric:        Behavior: Behavior normal.     Comments: Well groomed, good eye contact, normal speech and thoughts    Results for orders placed or performed in visit on 07/29/18  Lipid panel  Result Value Ref Range   Cholesterol 223 (H) <200 mg/dL   HDL 75 > OR = 50 mg/dL   Triglycerides 95  <150 mg/dL   LDL Cholesterol (Calc) 128 (H) mg/dL (calc)   Total CHOL/HDL Ratio 3.0 <5.0 (calc)   Non-HDL Cholesterol (Calc) 148 (H) <130 mg/dL (calc)  COMPLETE METABOLIC PANEL WITH GFR  Result Value Ref Range   Glucose, Bld 77 65 - 99 mg/dL   BUN 18 7 - 25 mg/dL   Creat 0.80 0.60 - 0.93 mg/dL   GFR, Est Non African American 73 > OR = 60 mL/min/1.30m2   GFR, Est African American 85 > OR = 60 mL/min/1.58m2   BUN/Creatinine Ratio NOT APPLICABLE 6 - 22 (calc)   Sodium 139 135 - 146 mmol/L   Potassium 4.0 3.5 - 5.3 mmol/L   Chloride 103 98 - 110 mmol/L   CO2 26 20 - 32 mmol/L   Calcium 9.4 8.6 - 10.4 mg/dL   Total Protein 6.5 6.1 - 8.1 g/dL   Albumin 4.5 3.6 - 5.1 g/dL   Globulin 2.0 1.9 - 3.7 g/dL (calc)   AG Ratio 2.3 1.0 - 2.5 (calc)   Total Bilirubin 0.5 0.2 - 1.2 mg/dL   Alkaline phosphatase (APISO) 65 37 - 153 U/L   AST 25 10 - 35 U/L   ALT 17 6 - 29 U/L  CBC with Differential/Platelet  Result Value Ref Range   WBC 8.7 3.8 - 10.8 Thousand/uL   RBC 4.50 3.80 - 5.10 Million/uL   Hemoglobin 15.0 11.7 - 15.5 g/dL   HCT 43.0 35.0 - 45.0 %   MCV 95.6 80.0 - 100.0 fL   MCH 33.3 (H) 27.0 - 33.0 pg   MCHC 34.9 32.0 - 36.0 g/dL   RDW 12.3 11.0 - 15.0 %   Platelets 279 140 - 400 Thousand/uL   MPV 10.5 7.5 - 12.5 fL   Neutro Abs 5,003 1,500 - 7,800 cells/uL   Lymphs Abs 2,941 850 - 3,900 cells/uL   Absolute Monocytes 609 200 - 950 cells/uL   Eosinophils Absolute 122 15 - 500 cells/uL   Basophils Absolute 26 0 - 200 cells/uL   Neutrophils Relative % 57.5 %   Total Lymphocyte 33.8 %   Monocytes Relative 7.0 %   Eosinophils Relative 1.4 %   Basophils Relative 0.3 %  Hemoglobin A1c  Result Value Ref Range   Hgb A1c MFr Bld 5.3 <5.7 % of total Hgb   Mean Plasma  Glucose 105 (calc)   eAG (mmol/L) 5.8 (calc)      Assessment & Plan:   Problem List Items Addressed This Visit    Insomnia   Relevant Medications   diazepam (VALIUM) 5 MG tablet    Other Visit Diagnoses    Acute  nonintractable headache, unspecified headache type    -  Primary   Relevant Medications   gabapentin (NEURONTIN) 100 MG capsule   Trigeminal neuralgia       Relevant Medications   gabapentin (NEURONTIN) 100 MG capsule   diazepam (VALIUM) 5 MG tablet      Acute headache, seems most consistent with trigeminal neuralgia or neurological related headache, given precise stabbing episodic pain constantly with radiation, without any other focal neuro symptoms. Not characteristic of migraine Limited conservative care tried so far only x 1 dose tylenol  Exam is stable neurological stand point, she has concerns of migraine or reduced artery circulation, but overall on exam and through history, we discussed unlikely to be carotid stenosis or arterial dysfunction given her sharp stabbing neuropathic pain only without any focal symptoms  Reassurance Trial gabapentin titration to effective dose per rx and AVS Re order Diazepam 5mg  PRN anxiety/insomnia Increase Tylenol Et Str to 1000mg  TID PRN Return precautions given if not improving  Meds ordered this encounter  Medications  . gabapentin (NEURONTIN) 100 MG capsule    Sig: Start 1-2 capsule daily, increase by 1 cap every 2-3 days as tolerated up to 3 times a day, or may take 3 at once in evening.    Dispense:  90 capsule    Refill:  1  . diazepam (VALIUM) 5 MG tablet    Sig: Take 1 tablet (5 mg total) by mouth at bedtime as needed for anxiety (sleep).    Dispense:  30 tablet    Refill:  0      Follow up plan: Return if symptoms worsen or fail to improve, for keep apt as scheduled.  Future labs ordered for 11/02/19   [name], [facility] 10/19/2019, 4:15 PM

## 2019-10-19 NOTE — Patient Instructions (Addendum)
Thank you for coming to the office today.  Start Gabapentin 100mg  capsules, take ONE OR TWO to start at night for 2-3 nights only, and then increase to 2 times a day for a few days, and then may increase to 3 times a day, it may make you drowsy, if helps significantly at night only, then you can increase instead to 3 capsules at night, instead of 3 times a day - In the future if needed, we can significantly increase the dose if tolerated well, some common doses are 300mg  three times a day up to 600mg  three times a day, usually it takes several weeks or months to get to higher doses  Start Diazepam as needed for anxiety/insomnia  Recommend to start taking Tylenol Extra Strength 500mg  tabs - take 1 to 2 tabs per dose (max 1000mg ) every 6-8 hours for pain (take regularly, don't skip a dose for next 7 days), max 24 hour daily dose is 6 tablets or 3000mg . In the future you can repeat the same everyday Tylenol course for 1-2 weeks at a time.    Please schedule a Follow-up Appointment to: Return if symptoms worsen or fail to improve, for keep apt as scheduled.  If you have any other questions or concerns, please feel free to call the office or send a message through [facility]. You may also schedule an earlier appointment if necessary.  Additionally, you may be receiving a survey about your experience at our office within a few days to 1 week by e-mail or mail. We value your feedback.  [name], DO [facility]

## 2019-11-02 ENCOUNTER — Other Ambulatory Visit: Payer: Self-pay

## 2019-11-02 ENCOUNTER — Other Ambulatory Visit: Payer: PPO

## 2019-11-02 DIAGNOSIS — Z0000 Encounter for general adult medical examination without abnormal findings: Secondary | ICD-10-CM | POA: Diagnosis not present

## 2019-11-02 DIAGNOSIS — F5101 Primary insomnia: Secondary | ICD-10-CM | POA: Diagnosis not present

## 2019-11-02 DIAGNOSIS — E7800 Pure hypercholesterolemia, unspecified: Secondary | ICD-10-CM

## 2019-11-02 DIAGNOSIS — R7309 Other abnormal glucose: Secondary | ICD-10-CM | POA: Diagnosis not present

## 2019-11-03 LAB — COMPLETE METABOLIC PANEL WITH GFR
AG Ratio: 1.9 (calc) (ref 1.0–2.5)
ALT: 15 U/L (ref 6–29)
AST: 18 U/L (ref 10–35)
Albumin: 4.1 g/dL (ref 3.6–5.1)
Alkaline phosphatase (APISO): 75 U/L (ref 37–153)
BUN: 19 mg/dL (ref 7–25)
CO2: 26 mmol/L (ref 20–32)
Calcium: 9.1 mg/dL (ref 8.6–10.4)
Chloride: 106 mmol/L (ref 98–110)
Creat: 0.8 mg/dL (ref 0.60–0.93)
GFR, Est African American: 84 mL/min/{1.73_m2} (ref >=60)
GFR, Est Non African American: 73 mL/min/{1.73_m2} (ref >=60)
Globulin: 2.2 g/dL (calc) (ref 1.9–3.7)
Glucose, Bld: 90 mg/dL (ref 65–99)
Potassium: 4.3 mmol/L (ref 3.5–5.3)
Sodium: 141 mmol/L (ref 135–146)
Total Bilirubin: 0.5 mg/dL (ref 0.2–1.2)
Total Protein: 6.3 g/dL (ref 6.1–8.1)

## 2019-11-03 LAB — LIPID PANEL
Cholesterol: 199 mg/dL (ref <200)
HDL: 73 mg/dL (ref >=50)
LDL Cholesterol (Calc): 106 mg/dL (calc) — ABNORMAL HIGH
Non-HDL Cholesterol (Calc): 126 mg/dL (calc) (ref <130)
Total CHOL/HDL Ratio: 2.7 (calc) (ref <5.0)
Triglycerides: 107 mg/dL (ref <150)

## 2019-11-03 LAB — TSH: TSH: 3.39 mIU/L (ref 0.40–4.50)

## 2019-11-03 LAB — CBC WITH DIFFERENTIAL/PLATELET
Absolute Monocytes: 346 cells/uL (ref 200–950)
Basophils Absolute: 48 cells/uL (ref 0–200)
Basophils Relative: 1 %
Eosinophils Absolute: 58 cells/uL (ref 15–500)
Eosinophils Relative: 1.2 %
HCT: 45.3 % — ABNORMAL HIGH (ref 35.0–45.0)
Hemoglobin: 14.9 g/dL (ref 11.7–15.5)
Lymphs Abs: 1670 cells/uL (ref 850–3900)
MCH: 33 pg (ref 27.0–33.0)
MCHC: 32.9 g/dL (ref 32.0–36.0)
MCV: 100.2 fL — ABNORMAL HIGH (ref 80.0–100.0)
MPV: 11.7 fL (ref 7.5–12.5)
Monocytes Relative: 7.2 %
Neutro Abs: 2678 cells/uL (ref 1500–7800)
Neutrophils Relative %: 55.8 %
Platelets: 131 10*3/uL — ABNORMAL LOW (ref 140–400)
RBC: 4.52 10*6/uL (ref 3.80–5.10)
RDW: 12.1 % (ref 11.0–15.0)
Total Lymphocyte: 34.8 %
WBC: 4.8 10*3/uL (ref 3.8–10.8)

## 2019-11-03 LAB — HEMOGLOBIN A1C
Hgb A1c MFr Bld: 4.9 % of total Hgb (ref <5.7)
Mean Plasma Glucose: 94 (calc)
eAG (mmol/L): 5.2 (calc)

## 2019-11-06 ENCOUNTER — Encounter: Payer: Self-pay | Admitting: Family Medicine

## 2019-11-06 ENCOUNTER — Other Ambulatory Visit: Payer: Self-pay

## 2019-11-06 ENCOUNTER — Ambulatory Visit (INDEPENDENT_AMBULATORY_CARE_PROVIDER_SITE_OTHER): Payer: PPO | Admitting: Family Medicine

## 2019-11-06 VITALS — BP 105/39 | HR 62 | Temp 98.0°F | Resp 16 | Ht 60.0 in | Wt 119.6 lb

## 2019-11-06 DIAGNOSIS — F5101 Primary insomnia: Secondary | ICD-10-CM

## 2019-11-06 DIAGNOSIS — Z0000 Encounter for general adult medical examination without abnormal findings: Secondary | ICD-10-CM | POA: Diagnosis not present

## 2019-11-06 DIAGNOSIS — E7800 Pure hypercholesterolemia, unspecified: Secondary | ICD-10-CM | POA: Diagnosis not present

## 2019-11-06 DIAGNOSIS — Z1231 Encounter for screening mammogram for malignant neoplasm of breast: Secondary | ICD-10-CM | POA: Diagnosis not present

## 2019-11-06 NOTE — Progress Notes (Signed)
Subjective:    Patient ID: Kim Ayala, female    DOB: 1946/02/23, 74 y.o.   MRN: [id number]  [name] is a 74 y.o. female presenting on 11/06/2019 for Annual Exam   HPI   Here for Annual Physical and Lab Review.  Lifestyle / HYPERLIPIDEMIA: - Reports no concerns. Improved lipids, LDL to 106 now otherwise normal. - Not taking any cholesterol medication. Not interested. Lifestyle - Diet: improving  Adjustment Disorder depression / anxiety,Insomnia Prior history of anxiety / adjustment to COVID pandemic and insomnia Taking Diazepam (valium) as needed only, rarely took had 2 rx in past 1 year, with good results PRN for insomnia.  Headaches Tried Gabapentin failed, had side effect.   Health Maintenance: UTD on COVID19 Vaccine  DEXA - last done 2012. She plans to keep doing strengthening. She is not interested in repeating a DEXA scan.   Breast Cancer Screening - declines routine screening mammogram, last done 2012, declines despite counseling on benefits of cancer prevention.  Colon CA Screening - declined colonoscopy / cologuard despite counseling on benefits of cancer screening. She has had prior colonscopy >10 years ago by report, do not have this record, it was done by [facility], now under [facility] will send records request.  Depression screen [facility] 2/9 11/06/2019 11/04/2018 09/18/2018  Decreased Interest 0 1 3  Down, Depressed, Hopeless 0 0 3  PHQ - 2 Score 0 1 6  Altered sleeping - - 3  Tired, decreased energy - - 3  Change in appetite - - 3  Feeling bad or failure about yourself  - - 0  Trouble concentrating - - 0  Moving slowly or fidgety/restless - - 0  Suicidal thoughts - - 0  PHQ-9 Score - - 15  Difficult doing work/chores - - Not difficult at all   GAD 7 : Generalized Anxiety Score 11/04/2018 09/18/2018  Nervous, Anxious, on Edge 1 3  Control/stop worrying 1 3  Worry too much - different things 1 3  Trouble relaxing 1 3  Restless 0 0  Easily annoyed  or irritable 1 0  Afraid - awful might happen 1 3  Total GAD 7 Score 6 15  Anxiety Difficulty Not difficult at all Not difficult at all     Past Medical History:  Diagnosis Date   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   Past Surgical History:  Procedure Laterality Date   ABDOMINAL HYSTERECTOMY  1975   unsure of type   APPENDECTOMY  1975   CATARACT EXTRACTION  2009   [place]   bone   LAPAROSCOPY Bilateral 08/30/2014   salpingo-oophorectomy and adhesiolysis   RADICAL HYSTERECTOMY WITH TRANSPOSITION OF OVARIES     Social History   Socioeconomic History   Marital status: Divorced    Spouse name: Not on file   Number of children: Not on file   Years of education: Not on file   Highest education level: Not on file  Occupational History   Not on file  Tobacco Use   Smoking status: Never Smoker   Smokeless tobacco: Never Used  Substance and Sexual Activity   Alcohol use: No    Alcohol/week: 0.0 standard drinks   Drug use: No   Sexual activity: Not Currently  Other Topics Concern   Not on file  Social History Narrative   Not on file   Social Determinants of Health   Financial Resource Strain:    Difficulty of Paying Living Expenses:  Food Insecurity:    Worried About Charity fundraiser in the Last Year:    Arboriculturist in the Last Year:   Transportation Needs:    Film/video editor (Medical):    Lack of Transportation (Non-Medical):   Physical Activity:    Days of Exercise per Week:    Minutes of Exercise per Session:   Stress:    Feeling of Stress :   Social Connections:    Frequency of Communication with Friends and Family:    Frequency of Social Gatherings with Friends and Family:    Attends Religious Services:    Active Member of Clubs or Organizations:    Attends Music therapist:    Marital Status:   Intimate Partner Violence:    Fear of Current or  Ex-Partner:    Emotionally Abused:    Physically Abused:    Sexually Abused:    Family History  Problem Relation Age of Onset   Heart disease Mother    Current Outpatient Medications on File Prior to Visit  Medication Sig   Acetaminophen 500 MG coapsule    Calcium 150 MG TABS Take 150 mg by mouth daily.   clotrimazole (LOTRIMIN) 1 % cream Apply 1 application topically 2 (two) times daily.   clotrimazole-betamethasone (LOTRISONE) cream Apply 1-2 times a day for worsening flare dry skin dermatitis of toes/feet, may re-use daily up to 1 week as needed.   diazepam (VALIUM) 5 MG tablet Take 1 tablet (5 mg total) by mouth at bedtime as needed for anxiety (sleep).   Multiple Vitamins-Minerals (MULTIVITAMIN WITH MINERALS) tablet Take 1 tablet by mouth daily.   valACYclovir (VALTREX) 1000 MG tablet Take 2 tablets (2000mg ) twice a day for 1 day, and then may take 1 tablet twice a day for up to 3-7 days if still persistent.   vitamin C (ASCORBIC ACID) 500 MG tablet    vitamin E 100 UNIT capsule Take by mouth daily.   No current facility-administered medications on file prior to visit.    Review of Systems  Constitutional: Negative for activity change, appetite change, chills, diaphoresis, fatigue and fever.  HENT: Negative for congestion and hearing loss.   Eyes: Negative for visual disturbance.  Respiratory: Negative for apnea, cough, chest tightness, shortness of breath and wheezing.   Cardiovascular: Negative for chest pain, palpitations and leg swelling.  Gastrointestinal: Negative for abdominal pain, constipation, diarrhea, nausea and vomiting.  Endocrine: Positive for cold intolerance.  Genitourinary: Negative for difficulty urinating, dysuria, frequency and hematuria.  Musculoskeletal: Negative for arthralgias and neck pain.  Skin: Negative for rash.  Allergic/Immunologic: Negative for environmental allergies.  Neurological: Negative for dizziness, weakness,  light-headedness, numbness and headaches.  Hematological: Negative for adenopathy.  Psychiatric/Behavioral: Negative for behavioral problems, dysphoric mood and sleep disturbance.   Per HPI unless specifically indicated above      Objective:    BP (!) 105/39    Pulse 62    Temp 98 F (36.7 C) (Temporal)    Resp 16    Ht 5' (1.524 m)    Wt 119 lb 9.6 oz (54.3 kg)    SpO2 99%    BMI 23.36 kg/m   Wt Readings from Last 3 Encounters:  11/06/19 119 lb 9.6 oz (54.3 kg)  10/19/19 121 lb (54.9 kg)  11/04/18 121 lb 12.8 oz (55.2 kg)    Physical Exam Vitals and nursing note reviewed.  Constitutional:      General: She is not in acute  distress.    Appearance: She is well-developed. She is not diaphoretic.     Comments: Well-appearing, comfortable, cooperative  HENT:     Head: Normocephalic and atraumatic.  Eyes:     General:        Right eye: No discharge.        Left eye: No discharge.     Conjunctiva/sclera: Conjunctivae normal.     Pupils: Pupils are equal, round, and reactive to light.  Neck:     Thyroid: No thyromegaly.     Vascular: No carotid bruit.  Cardiovascular:     Rate and Rhythm: Normal rate and regular rhythm.     Heart sounds: Normal heart sounds. No murmur heard.   Pulmonary:     Effort: Pulmonary effort is normal. No respiratory distress.     Breath sounds: Normal breath sounds. No wheezing or rales.  Abdominal:     General: Bowel sounds are normal. There is no distension.     Palpations: Abdomen is soft. There is no mass.     Tenderness: There is no abdominal tenderness.  Musculoskeletal:        General: No tenderness. Normal range of motion.     Cervical back: Normal range of motion and neck supple.     Comments: Upper / Lower Extremities: - Normal muscle tone, strength bilateral upper extremities 5/5, lower extremities 5/5  Lymphadenopathy:     Cervical: No cervical adenopathy.  Skin:    General: Skin is warm and dry.     Findings: No erythema or rash.    Neurological:     Mental Status: She is alert and oriented to person, place, and time.     Comments: Distal sensation intact to light touch all extremities  Psychiatric:        Behavior: Behavior normal.     Comments: Well groomed, good eye contact, normal speech and thoughts       Results for orders placed or performed in visit on 11/02/19  TSH  Result Value Ref Range   TSH 3.39 0.40 - 4.50 mIU/L  Lipid panel  Result Value Ref Range   Cholesterol 199 <200 mg/dL   HDL 73 > OR = 50 mg/dL   Triglycerides 107 <150 mg/dL   LDL Cholesterol (Calc) 106 (H) mg/dL (calc)   Total CHOL/HDL Ratio 2.7 <5.0 (calc)   Non-HDL Cholesterol (Calc) 126 <130 mg/dL (calc)  COMPLETE METABOLIC PANEL WITH GFR  Result Value Ref Range   Glucose, Bld 90 65 - 99 mg/dL   BUN 19 7 - 25 mg/dL   Creat 0.80 0.60 - 0.93 mg/dL   GFR, Est Non African American 73 > OR = 60 mL/min/1.76m2   GFR, Est African American 84 > OR = 60 mL/min/1.4m2   BUN/Creatinine Ratio NOT APPLICABLE 6 - 22 (calc)   Sodium 141 135 - 146 mmol/L   Potassium 4.3 3.5 - 5.3 mmol/L   Chloride 106 98 - 110 mmol/L   CO2 26 20 - 32 mmol/L   Calcium 9.1 8.6 - 10.4 mg/dL   Total Protein 6.3 6.1 - 8.1 g/dL   Albumin 4.1 3.6 - 5.1 g/dL   Globulin 2.2 1.9 - 3.7 g/dL (calc)   AG Ratio 1.9 1.0 - 2.5 (calc)   Total Bilirubin 0.5 0.2 - 1.2 mg/dL   Alkaline phosphatase (APISO) 75 37 - 153 U/L   AST 18 10 - 35 U/L   ALT 15 6 - 29 U/L  CBC with Differential/Platelet  Result Value  Ref Range   WBC 4.8 3.8 - 10.8 Thousand/uL   RBC 4.52 3.80 - 5.10 Million/uL   Hemoglobin 14.9 11.7 - 15.5 g/dL   HCT 45.3 (H) 35 - 45 %   MCV 100.2 (H) 80.0 - 100.0 fL   MCH 33.0 27.0 - 33.0 pg   MCHC 32.9 32.0 - 36.0 g/dL   RDW 12.1 11.0 - 15.0 %   Platelets 131 (L) 140 - 400 Thousand/uL   MPV 11.7 7.5 - 12.5 fL   Neutro Abs 2,678 1,500 - 7,800 cells/uL   Lymphs Abs 1,670 850 - 3,900 cells/uL   Absolute Monocytes 346 200 - 950 cells/uL   Eosinophils Absolute  58 15 - 500 cells/uL   Basophils Absolute 48 0 - 200 cells/uL   Neutrophils Relative % 55.8 %   Total Lymphocyte 34.8 %   Monocytes Relative 7.2 %   Eosinophils Relative 1.2 %   Basophils Relative 1.0 %  Hemoglobin A1c  Result Value Ref Range   Hgb A1c MFr Bld 4.9 <5.7 % of total Hgb   Mean Plasma Glucose 94 (calc)   eAG (mmol/L) 5.2 (calc)      Assessment & Plan:   Problem List Items Addressed This Visit    Pure hypercholesterolemia   Insomnia    Other Visit Diagnoses    Annual physical exam    -  Primary   Encounter for screening mammogram for malignant neoplasm of breast          Updated Health Maintenance information - Declines all cancer screening today (mammogram, colonoscopy) despite detailed counseling on disease prevention and recommendations/guidelines Reviewed recent lab results with patient - Offered Statin therapy based on mild elevated ASCVD risk >10% calculated, she declines due to improved cholesterol and preference to avoid statin therapy. Encouraged improvement to lifestyle with diet and exercise - Goal of weight loss   No orders of the defined types were placed in this encounter.     Follow up plan: Return in about 1 year (around 11/05/2020) for Annual Physical.  [name], DO [facility] 11/06/2019, 1:37 PM

## 2019-11-06 NOTE — Patient Instructions (Addendum)
Thank you for coming to the office today.  1. Chemistry - Normal results, including electrolytes, kidney and liver function. Normal fasting blood sugar   2. Hemoglobin A1c (Diabetes screening) - 4.9, normal not in range of Pre-Diabetes (>5.7 to 6.4) improved from prior 5.1 to 5.3  3. TSH Thyroid Function Tests - Normal.  4. Cholesterol - Improved cholesterol. Total cholesterol down from 223 to 199. HDL is stable at 73. LDL is improved from 128 to 106.  5. CBC Blood Counts - Moderately reduced platelets from 250-270s down to 131. Slightly increased MCV. Otherwise, mostly normal, no anemia   DUE for FASTING BLOOD WORK (no food or drink after midnight before the lab appointment, only water or coffee without cream/sugar on the morning of)  SCHEDULE "Lab Only" visit in the morning at the clinic for lab draw in 1 YEAR  - Make sure Lab Only appointment is at about 1 week before your next appointment, so that results will be available  For Lab Results, once available within 2-3 days of blood draw, you can can log in to MyChart online to view your results and a brief explanation. Also, we can discuss results at next follow-up visit.   Please schedule a Follow-up Appointment to: Return in about 1 year (around 11/05/2020) for Annual Physical.  If you have any other questions or concerns, please feel free to call the office or send a message through [place]. You may also schedule an earlier appointment if necessary.  Additionally, you may be receiving a survey about your experience at our office within a few days to 1 week by e-mail or mail. We value your feedback.  [name], DO [place]

## 2019-12-01 ENCOUNTER — Telehealth: Payer: Self-pay | Admitting: Family Medicine

## 2019-12-01 NOTE — Telephone Encounter (Signed)
Recently seen for physical 10/2019, we attempted to request record for her last colonoscopy done >10 years ago. We received response that no record of Colonoscopy was on file through [facility].  Please notify patient that I would recommend pursuing some new type of colon cancer screening at this time.  We can order Cologuard home stool kit option if she is agreeable to this instead of repeat colonoscopy.  [name], [facility] 12/01/2019, 5:39 PM

## 2019-12-02 ENCOUNTER — Telehealth: Payer: Self-pay | Admitting: Family Medicine

## 2019-12-02 DIAGNOSIS — Z1211 Encounter for screening for malignant neoplasm of colon: Secondary | ICD-10-CM

## 2019-12-02 NOTE — Telephone Encounter (Signed)
Patient agree for cologuard.

## 2019-12-02 NOTE — Telephone Encounter (Signed)
Signed cologuard order  [name], [facility] 12/02/2019, 3:53 PM

## 2019-12-08 ENCOUNTER — Encounter: Payer: Self-pay | Admitting: Family Medicine

## 2019-12-22 DIAGNOSIS — Z1211 Encounter for screening for malignant neoplasm of colon: Secondary | ICD-10-CM | POA: Diagnosis not present

## 2019-12-24 LAB — COLOGUARD: Cologuard: NEGATIVE

## 2020-01-04 ENCOUNTER — Encounter: Payer: Self-pay | Admitting: Family Medicine

## 2020-01-20 DIAGNOSIS — H26493 Other secondary cataract, bilateral: Secondary | ICD-10-CM | POA: Diagnosis not present

## 2020-04-12 ENCOUNTER — Ambulatory Visit (INDEPENDENT_AMBULATORY_CARE_PROVIDER_SITE_OTHER): Payer: PPO | Admitting: Family Medicine

## 2020-04-12 ENCOUNTER — Encounter: Payer: Self-pay | Admitting: Family Medicine

## 2020-04-12 ENCOUNTER — Other Ambulatory Visit: Payer: Self-pay

## 2020-04-12 DIAGNOSIS — H9201 Otalgia, right ear: Secondary | ICD-10-CM | POA: Diagnosis not present

## 2020-04-12 DIAGNOSIS — J0110 Acute frontal sinusitis, unspecified: Secondary | ICD-10-CM | POA: Diagnosis not present

## 2020-04-12 DIAGNOSIS — F4323 Adjustment disorder with mixed anxiety and depressed mood: Secondary | ICD-10-CM

## 2020-04-12 DIAGNOSIS — F5101 Primary insomnia: Secondary | ICD-10-CM | POA: Diagnosis not present

## 2020-04-12 MED ORDER — DIAZEPAM 5 MG PO TABS: 5.0000 mg | ORAL_TABLET | Freq: Every evening | ORAL | 0 refills | Status: DC | PRN

## 2020-04-12 MED ORDER — AMOXICILLIN-POT CLAVULANATE 875-125 MG PO TABS: 1.0000 | ORAL_TABLET | Freq: Two times a day (BID) | ORAL | 0 refills | Status: DC

## 2020-04-12 NOTE — Progress Notes (Signed)
Virtual Visit via Telephone The purpose of this virtual visit is to provide medical care while limiting exposure to the novel coronavirus (COVID19) for both patient and office staff.  Consent was obtained for phone visit:  Yes.   Answered questions that patient had about telehealth interaction:  Yes.   I discussed the limitations, risks, security and privacy concerns of performing an evaluation and management service by telephone. I also discussed with the patient that there may be a patient responsible charge related to this service. The patient expressed understanding and agreed to proceed.  Patient Location: Home Provider Location: [facility] (Office)  Participants in virtual visit: - Patient: Kim Ayala - CMA: Frederich Cha, [place] - Provider: Dr Parks Ranger  ---------------------------------------------------------------------- Chief Complaint  Patient presents with  . Ear Pain    Right side, fever onset yesterday getting worst, sinus drainage     S: Reviewed CMA documentation. I have called patient and gathered additional HPI as follows:  Right Ear Ache / Sinusitis / Fever Reports that symptoms started past week, now worse yesterday started fever symptoms, having R ear pain and pressure.  - Tried OTC nasal spray without relief - In past R ear has had infection usually treated with Amoxicillin in past.  Adjustment Disorder depression / anxiety,Insomnia Prior history of anxiety / adjustment to COVID pandemic and insomnia Taking Diazepam (valium) as needed only, rarely took had 2 rx in past 1 year, with good results PRN for insomnia. Last rx sent 10/2019, she has used 30 pills over course of >5 months now out of med and considering refill today.  Denies any known or suspected exposure to person with or possibly with COVID19.  Admit sinus pain and pressure, Right ear ache. Denies any chills, sweats, body ache, cough, shortness of breath, headache, abdominal pain,  diarrhea  Past Medical History:  Diagnosis Date  . 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 Smoking status: Never Smoker  . Smokeless tobacco: Never Used  Substance Use Topics  . Alcohol use: No    Alcohol/week: 0.0 standard drinks  . Drug use: No    Current Outpatient Medications:  .  Calcium 150 MG TABS, Take 150 mg by mouth daily., Disp: , Rfl:  .  vitamin C (ASCORBIC ACID) 500 MG tablet, , Disp: , Rfl:  .  vitamin E 100 UNIT capsule, Take by mouth daily., Disp: , Rfl:  .  Acetaminophen 500 MG coapsule, , Disp: , Rfl:  .  diazepam (VALIUM) 5 MG tablet, Take 1 tablet (5 mg total) by mouth at bedtime as needed for anxiety (sleep). (Patient not taking: Reported on 04/12/2020), Disp: 30 tablet, Rfl: 0 .  valACYclovir (VALTREX) 1000 MG tablet, Take 2 tablets (2000mg ) twice a day for 1 day, and then may take 1 tablet twice a day for up to 3-7 days if still persistent. (Patient not taking: Reported on 04/12/2020), Disp: 20 tablet, Rfl: 2  Depression screen [facility] 2/9 04/12/2020 11/06/2019 11/04/2018  Decreased Interest 0 0 1  Down, Depressed, Hopeless 0 0 0  PHQ - 2 Score 0 0 1  Altered sleeping - - -  Tired, decreased energy - - -  Change in appetite - - -  Feeling bad or failure about yourself  - - -  Trouble concentrating - - -  Moving slowly or fidgety/restless - - -  Suicidal thoughts - - -  PHQ-9 Score - - -  Difficult doing work/chores - - -  GAD 7 : Generalized Anxiety Score 11/04/2018 09/18/2018  Nervous, Anxious, on Edge 1 3  Control/stop worrying 1 3  Worry too much - different things 1 3  Trouble relaxing 1 3  Restless 0 0  Easily annoyed or irritable 1 0  Afraid - awful might happen 1 3  Total GAD 7 Score 6 15  Anxiety Difficulty Not difficult at all Not difficult at all    -------------------------------------------------------------------------- O: No physical exam performed due to remote telephone encounter.  Lab  results reviewed.  No results found for this or any previous visit (from the past 2160 hour(s)).  -------------------------------------------------------------------------- A&P:  Problem List Items Addressed This Visit    Insomnia   Relevant Medications   diazepam (VALIUM) 5 MG tablet    Other Visit Diagnoses    Acute non-recurrent frontal sinusitis    -  Primary   Relevant Medications   amoxicillin-clavulanate (AUGMENTIN) 875-125 MG tablet   Acute ear pain, right       Relevant Medications   amoxicillin-clavulanate (AUGMENTIN) 875-125 MG tablet   Acute adjustment disorder with mixed anxiety and depressed mood         Consistent with acute frontal sinusitis, likely initially viral URI vs allergic rhinitis component with worsening concern for bacterial infection.  Also can be secondary R AOM with known prior recurrent R ear infections. Limited by telephone visit today.  Plan: 1 Start Augmentin 875-125mg  PO BID x 10 days 2. Offer nasal spray flonase / atrovent - she declined, may continue OTC supportive meds Return criteria reviewed  #Acute adjustment disorder / mood anxiety and Insomnia Reviewed PDMP for Diazepam, last filled 10/2019, will re order 30 pills use intermittently PRN   No orders of the defined types were placed in this encounter.   Follow-up: - Return in 1-2 weeks  Patient verbalizes understanding with the above medical recommendations including the limitation of remote medical advice.  Specific follow-up and call-back criteria were given for patient to follow-up or seek medical care more urgently if needed.   - Time spent in direct consultation with patient on phone: 10 minutes   [name], [facility] 04/12/2020, 4:41 PM

## 2020-04-20 ENCOUNTER — Telehealth: Payer: Self-pay

## 2020-04-20 NOTE — Telephone Encounter (Signed)
Copied from [place] [phone]. Topic: General - Other >> Apr 20, 2020 11:31 AM Keene Breath wrote: Reason for CRM: Patient called to ask the doctor when it would be ok for her to get the booster vaccine since she is taking antibiotics until this Friday.  Please call patient to let her know.  CB# [phone]

## 2020-04-20 NOTE — Telephone Encounter (Signed)
Suggest to wait week before getting booster she has two more days left to finish amoxicillin.

## 2020-07-19 ENCOUNTER — Ambulatory Visit (INDEPENDENT_AMBULATORY_CARE_PROVIDER_SITE_OTHER): Payer: PPO | Admitting: Family Medicine

## 2020-07-19 ENCOUNTER — Other Ambulatory Visit: Payer: Self-pay | Admitting: Family Medicine

## 2020-07-19 ENCOUNTER — Other Ambulatory Visit: Payer: Self-pay

## 2020-07-19 ENCOUNTER — Encounter: Payer: Self-pay | Admitting: Family Medicine

## 2020-07-19 VITALS — BP 123/40 | HR 60 | Temp 97.8°F | Ht 61.0 in | Wt 122.6 lb

## 2020-07-19 DIAGNOSIS — F5101 Primary insomnia: Secondary | ICD-10-CM | POA: Diagnosis not present

## 2020-07-19 DIAGNOSIS — R7309 Other abnormal glucose: Secondary | ICD-10-CM

## 2020-07-19 DIAGNOSIS — G8929 Other chronic pain: Secondary | ICD-10-CM | POA: Diagnosis not present

## 2020-07-19 DIAGNOSIS — Z0000 Encounter for general adult medical examination without abnormal findings: Secondary | ICD-10-CM

## 2020-07-19 DIAGNOSIS — R519 Headache, unspecified: Secondary | ICD-10-CM | POA: Diagnosis not present

## 2020-07-19 DIAGNOSIS — F4323 Adjustment disorder with mixed anxiety and depressed mood: Secondary | ICD-10-CM

## 2020-07-19 DIAGNOSIS — E7800 Pure hypercholesterolemia, unspecified: Secondary | ICD-10-CM

## 2020-07-19 MED ORDER — DIAZEPAM 5 MG PO TABS: 5.0000 mg | ORAL_TABLET | Freq: Every evening | ORAL | 0 refills | Status: DC | PRN

## 2020-07-19 MED ORDER — ACETAMINOPHEN 500 MG PO TABS: 1000.0000 mg | ORAL_TABLET | Freq: Three times a day (TID) | ORAL | 0 refills | Status: AC | PRN

## 2020-07-19 NOTE — Progress Notes (Signed)
Subjective:    Patient ID: Kim Ayala, female    DOB: [birth year], 75 y.o.   MRN: [id number]  Kim Ayala is a 75 y.o. female presenting on 07/19/2020 for Neck Pain (Left side pain and patient believes it started with her headaches.), Stress (Pt has stress going on due to some loss of her closest people.), and Headache (It started about a week and half ago and it continues to happen on a daily basis. Pt has been taking 500 mg Tylenol it only helps to calm it down but the ache comes back once the medication wares out. The pain rate is 8-10 when its so intolerant for her.)   HPI   Chronic Headache Chronic problem for past 1 year+ Background history of losing a dental bridge, and awaiting on implant. She has had x 2 dental surgery within past 1 year. Last surgery done 06/2020, then waiting 3 months until can determine if can do bridge again. - Today headache improved. - She has used Tylenol 500mg  1-2 times a day PRN some relief - Seems to have stress induced related headaches, based on her awareness of current headache symptoms. - Denies any migraine symptoms, without nausea and other assoc triggers.  Adjustment Disorder depression / anxiety,Insomnia Prior history of anxiety / adjustment to COVID pandemic and insomnia Multiple stressful events trigger her headaches. Taking Diazepam (valium) as needed only, rarely took had 2 rx in past 1 year, with good results PRN for insomnia. Out of med, Request re order today help with her headache has not slept well   Depression screen [facility] 2/9 07/19/2020 04/12/2020 11/06/2019  Decreased Interest 0 0 0  Down, Depressed, Hopeless 0 0 0  PHQ - 2 Score 0 0 0  Altered sleeping - - -  Tired, decreased energy - - -  Change in appetite - - -  Feeling bad or failure about yourself  - - -  Trouble concentrating - - -  Moving slowly or fidgety/restless - - -  Suicidal thoughts - - -  PHQ-9 Score - - -  Difficult doing work/chores - - -    Social History    Tobacco Use  . Smoking status: Never Smoker  . Smokeless tobacco: Never Used  Substance Use Topics  . Alcohol use: No    Alcohol/week: 0.0 standard drinks  . Drug use: No    Review of Systems Per HPI unless specifically indicated above     Objective:    BP (!) 123/40 (BP Location: Left Arm, Patient Position: Sitting, Cuff Size: Normal)   Pulse 60   Temp 97.8 F (36.6 C) (Temporal)   Ht 5\' 1"  (1.549 m)   Wt 122 lb 9.6 oz (55.6 kg)   SpO2 99%   BMI 23.17 kg/m   Wt Readings from Last 3 Encounters:  07/19/20 122 lb 9.6 oz (55.6 kg)  11/06/19 119 lb 9.6 oz (54.3 kg)  10/19/19 121 lb (54.9 kg)    Physical Exam Vitals and nursing note reviewed.  Constitutional:      General: She is not in acute distress.    Appearance: She is well-developed and well-nourished. She is not diaphoretic.     Comments: Well-appearing, comfortable, cooperative  HENT:     Head: Normocephalic and atraumatic.     Mouth/Throat:     Mouth: Oropharynx is clear and moist.  Eyes:     General:        Right eye: No discharge.  Left eye: No discharge.     Conjunctiva/sclera: Conjunctivae normal.  Neck:     Thyroid: No thyromegaly.     Vascular: No carotid bruit.  Cardiovascular:     Rate and Rhythm: Normal rate and regular rhythm.     Pulses: Intact distal pulses.     Heart sounds: Normal heart sounds. No murmur heard.   Pulmonary:     Effort: Pulmonary effort is normal. No respiratory distress.     Breath sounds: Normal breath sounds. No wheezing or rales.  Musculoskeletal:        General: No edema. Normal range of motion.     Cervical back: Normal range of motion and neck supple.     Right lower leg: No edema.     Left lower leg: No edema.  Lymphadenopathy:     Cervical: No cervical adenopathy.  Skin:    General: Skin is warm and dry.     Findings: No erythema or rash.  Neurological:     Mental Status: She is alert and oriented to person, place, and time.  Psychiatric:         Mood and Affect: Mood and affect normal.        Behavior: Behavior normal.     Comments: Well groomed, good eye contact, normal speech and thoughts    Results for orders placed or performed in visit on 01/04/20  Cologuard  Result Value Ref Range   Cologuard Negative Negative      Assessment & Plan:   Problem List Items Addressed This Visit    Insomnia   Relevant Medications   diazepam (VALIUM) 5 MG tablet    Other Visit Diagnoses    Chronic nonintractable headache, unspecified headache type    -  Primary   Relevant Medications   acetaminophen (TYLENOL) 500 MG tablet   Acute adjustment disorder with mixed anxiety and depressed mood       Relevant Medications   diazepam (VALIUM) 5 MG tablet      Clinically with headache, secondary to anxiety/stress / insomnia Underlying dental surgical issue, recurrent problem  Reassurance today no sign of migraine or other pathology that would explain  Increase Tylenol to 500mg -1000mg  TID PRN Refill Diazepam PRN usage only, PDMP reviewed   Meds ordered this encounter  Medications  . diazepam (VALIUM) 5 MG tablet    Sig: Take 1 tablet (5 mg total) by mouth at bedtime as needed for anxiety (sleep).    Dispense:  30 tablet    Refill:  0  . acetaminophen (TYLENOL) 500 MG tablet    Sig: Take 2 tablets (1,000 mg total) by mouth every 8 (eight) hours as needed.    Dispense:  30 tablet    Refill:  0     Follow up plan: Return in about 4 months (around 11/18/2020) for 4 month fasting lab only then 1 week later Annual Physical.  Future labs ordered 11/29/20  [name], [facility] 07/19/2020, 3:23 PM

## 2020-07-19 NOTE — Patient Instructions (Addendum)
Thank you for coming to the office today.  Refilled DIazepam use as needed  Recommend to start taking Tylenol Extra Strength 500mg  tabs - take 1 to 2 tabs per dose (max 1000mg ) every 6-8 hours for pain, max 24 hour daily dose is 6 tablets or 3000mg    DUE for FASTING BLOOD WORK (no food or drink after midnight before the lab appointment, only water or coffee without cream/sugar on the morning of)  SCHEDULE "Lab Only" visit in the morning at the clinic for lab draw in 4 MONTHS   - Make sure Lab Only appointment is at about 1 week before your next appointment, so that results will be available  For Lab Results, once available within 2-3 days of blood draw, you can can log in to MyChart online to view your results and a brief explanation. Also, we can discuss results at next follow-up visit.   Please schedule a Follow-up Appointment to: Return in about 4 months (around 11/18/2020) for 4 month fasting lab only then 1 week later Annual Physical.  If you have any other questions or concerns, please feel free to call the office or send a message through [place]. You may also schedule an earlier appointment if necessary.  Additionally, you may be receiving a survey about your experience at our office within a few days to 1 week by e-mail or mail. We value your feedback.  [name], DO [place]

## 2020-08-18 ENCOUNTER — Other Ambulatory Visit: Payer: Self-pay | Admitting: Family Medicine

## 2020-08-18 DIAGNOSIS — F5101 Primary insomnia: Secondary | ICD-10-CM

## 2020-08-18 DIAGNOSIS — F4323 Adjustment disorder with mixed anxiety and depressed mood: Secondary | ICD-10-CM

## 2020-08-18 NOTE — Telephone Encounter (Signed)
Requested medication (s) are due for refill today: yes  Requested medication (s) are on the active medication list: yes  Last refill:  07/19/2020  Future visit scheduled:yes   Notes to clinic:  this refill cannot be delegated    Requested Prescriptions  Pending Prescriptions Disp Refills   diazepam (VALIUM) 5 MG tablet [Pharmacy Med Name: DIAZEPAM 5 MG TAB] 30 tablet     Sig: TAKE 1 TABLET BY MOUTH AT BEDTIME AS NEEDED FOR ANXIETY      Not Delegated - Psychiatry:  Anxiolytics/Hypnotics Failed - 08/18/2020 10:06 AM      Failed - This refill cannot be delegated      Failed - Urine Drug Screen completed in last 360 days      Passed - Valid encounter within last 6 months    Recent Outpatient Visits           1 month ago Chronic nonintractable headache, unspecified headache type   [place], DO   4 months ago Acute non-recurrent frontal sinusitis   [facility] [place], Devonne Doughty, DO   9 months ago Annual physical exam   [place], DO   10 months ago Acute nonintractable headache, unspecified headache type   [place], Devonne Doughty, DO   1 year ago Annual physical exam   [facility] Olin Hauser, DO       Future Appointments             In 3 months Parks Ranger, Devonne Doughty, [facility], [facility]

## 2020-09-16 ENCOUNTER — Other Ambulatory Visit: Payer: Self-pay

## 2020-09-16 ENCOUNTER — Encounter: Payer: Self-pay | Admitting: Family Medicine

## 2020-09-16 ENCOUNTER — Telehealth (INDEPENDENT_AMBULATORY_CARE_PROVIDER_SITE_OTHER): Payer: PPO | Admitting: Family Medicine

## 2020-09-16 VITALS — Temp 99.4°F | Ht 61.0 in | Wt 122.0 lb

## 2020-09-16 DIAGNOSIS — B349 Viral infection, unspecified: Secondary | ICD-10-CM | POA: Diagnosis not present

## 2020-09-16 MED ORDER — PREDNISONE 20 MG PO TABS: ORAL_TABLET | ORAL | 0 refills | Status: DC

## 2020-09-16 NOTE — Progress Notes (Signed)
Virtual Visit via Telephone The purpose of this virtual visit is to provide medical care while limiting exposure to the novel coronavirus (COVID19) for both patient and office staff.  Consent was obtained for phone visit:  Yes.   Answered questions that patient had about telehealth interaction:  Yes.   I discussed the limitations, risks, security and privacy concerns of performing an evaluation and management service by telephone. I also discussed with the patient that there may be a patient responsible charge related to this service. The patient expressed understanding and agreed to proceed.  Patient Location: Home Provider Location: [facility] (Office)  Participants in virtual visit: - Patient: Kim Ayala - CMA: Orinda Kenner, [place] - Provider: Dr Parks Ranger  ---------------------------------------------------------------------- Chief Complaint  Patient presents with  . Fever  . Fatigue  . Nasal Congestion    S: Reviewed CMA documentation. I have called patient and gathered additional HPI as follows:  Flu-Like Viral Syndrome Possible COVID19 Reports that symptoms started last weekend Saturday 4/30 with fever temp >101, coughing spells worse at night, but she feels likes do feel clear without productive. She feels generalized aching and malaise. Admits feels very drained, out of energy. - She did x 2 Home COVID tests in past week, last home test was about 2-3 days ago. She repeated test 2 days later. Both were negative  COVID Vaccine Booster 05/18/20   Patient is currently in isolation Denies any known or suspected exposure to person with or possibly with [place].  Denies any chills, sweats, shortness of breath, sinus pain or pressure, headache, abdominal pain, diarrhea  Past Medical History:  Diagnosis Date  . 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 Smoking status: Never Smoker  . Smokeless tobacco: Never Used   Substance Use Topics  . Alcohol use: No    Alcohol/week: 0.0 standard drinks  . Drug use: No    Current Outpatient Medications:  .  acetaminophen (TYLENOL) 500 MG tablet, Take 2 tablets (1,000 mg total) by mouth every 8 (eight) hours as needed., Disp: 30 tablet, Rfl: 0 .  Calcium 150 MG TABS, Take 150 mg by mouth daily., Disp: , Rfl:  .  diazepam (VALIUM) 5 MG tablet, TAKE 1 TABLET BY MOUTH AT BEDTIME AS NEEDED FOR ANXIETY, Disp: 30 tablet, Rfl: 2 .  predniSONE (DELTASONE) 20 MG tablet, Take daily with food. Start with 60mg  (3 pills) x 2 days, then reduce to 40mg  (2 pills) x 2 days, then 20mg  (1 pill) x 3 days, Disp: 13 tablet, Rfl: 0 .  vitamin C (ASCORBIC ACID) 500 MG tablet, , Disp: , Rfl:  .  vitamin E 100 UNIT capsule, Take by mouth daily. (Patient not taking: No sig reported), Disp: , Rfl:   Depression screen [facility] 2/9 07/19/2020 04/12/2020 11/06/2019  Decreased Interest 0 0 0  Down, Depressed, Hopeless 0 0 0  PHQ - 2 Score 0 0 0  Altered sleeping - - -  Tired, decreased energy - - -  Change in appetite - - -  Feeling bad or failure about yourself  - - -  Trouble concentrating - - -  Moving slowly or fidgety/restless - - -  Suicidal thoughts - - -  PHQ-9 Score - - -  Difficult doing work/chores - - -    GAD 7 : Generalized Anxiety Score 11/04/2018 09/18/2018  Nervous, Anxious, on Edge 1 3  Control/stop worrying 1 3  Worry too much - different things 1  3  Trouble relaxing 1 3  Restless 0 0  Easily annoyed or irritable 1 0  Afraid - awful might happen 1 3  Total GAD 7 Score 6 15  Anxiety Difficulty Not difficult at all Not difficult at all    -------------------------------------------------------------------------- O: No physical exam performed due to remote telephone encounter.  Lab results reviewed.  No results found for this or any previous visit (from the past 2160 hour(s)).  -------------------------------------------------------------------------- A&P:  Problem  List Items Addressed This Visit   None   Visit Diagnoses    Acute viral syndrome    -  Primary   Relevant Medications   predniSONE (DELTASONE) 20 MG tablet     Acute viral presentation Possible COVID cannot rule out Patient is vaccinated with booster Pfizer 05/2020, and not received 2nd booster dose. No known sick contact Home COVID rapid test x 2 negative done 2-3 days apart in past 1 week. Febrile still recently. Generalized symptoms. Non focal.  Start Prednisone taper 7 day for symptoms No acute respiratory concerns. Defer other cough / congestion medication symptom relief OTC Tylenol PRN Follow-up within 1 week if needed, can do another virtual if still febrile, can do drive by for PCR COVID test, can do X-ray if indicated.  Return criteria reviewed  Meds ordered this encounter  Medications  . predniSONE (DELTASONE) 20 MG tablet    Sig: Take daily with food. Start with 60mg  (3 pills) x 2 days, then reduce to 40mg  (2 pills) x 2 days, then 20mg  (1 pill) x 3 days    Dispense:  13 tablet    Refill:  0    Follow-up: - Return in 1 week if needed  Patient verbalizes understanding with the above medical recommendations including the limitation of remote medical advice.  Specific follow-up and call-back criteria were given for patient to follow-up or seek medical care more urgently if needed.   - Time spent in direct consultation with patient on phone: 12 minutes   [name], [facility] 09/16/2020, 3:31 PM

## 2020-09-16 NOTE — Patient Instructions (Addendum)
    Please schedule a Follow-up Appointment to: Return in about 1 week (around 09/23/2020), or if symptoms worsen or fail to improve, for possible COVID / viral.  If you have any other questions or concerns, please feel free to call the office or send a message through [place]. You may also schedule an earlier appointment if necessary.  Additionally, you may be receiving a survey about your experience at our office within a few days to 1 week by e-mail or mail. We value your feedback.  [name], DO [place]

## 2020-11-12 ENCOUNTER — Other Ambulatory Visit: Payer: Self-pay | Admitting: Family Medicine

## 2020-11-12 DIAGNOSIS — F4323 Adjustment disorder with mixed anxiety and depressed mood: Secondary | ICD-10-CM

## 2020-11-12 DIAGNOSIS — F5101 Primary insomnia: Secondary | ICD-10-CM

## 2020-11-12 NOTE — Telephone Encounter (Signed)
Requested medication (s) are due for refill today: yes  Requested medication (s) are on the active medication list: yes  Last refill:  08/18/20  Future visit scheduled: yes  Notes to clinic:  med not delegated to NT to RF   Requested Prescriptions  Pending Prescriptions Disp Refills   diazepam (VALIUM) 5 MG tablet [Pharmacy Med Name: DIAZEPAM 5 MG TAB] 30 tablet     Sig: TAKE 1 TABLET BY MOUTH AT BEDTIME AS NEEDED FOR ANXIETY      Not Delegated - Psychiatry:  Anxiolytics/Hypnotics Failed - 11/12/2020 12:40 PM      Failed - This refill cannot be delegated      Failed - Urine Drug Screen completed in last 360 days      Passed - Valid encounter within last 6 months    Recent Outpatient Visits           1 month ago Acute viral syndrome   [place], DO   3 months ago Chronic nonintractable headache, unspecified headache type   [place], DO   7 months ago Acute non-recurrent frontal sinusitis   [place], DO   1 year ago Annual physical exam   [place], DO   1 year ago Acute nonintractable headache, unspecified headache type   [place], DO       Future Appointments             In 3 weeks Parks Ranger, Devonne Doughty, [facility], [facility]

## 2020-11-24 ENCOUNTER — Telehealth: Payer: Self-pay | Admitting: Family Medicine

## 2020-11-24 NOTE — Telephone Encounter (Signed)
Patient declined the Medicare Wellness Visit with [facility] .  Stated she did not have a good experience when the last AWV was completed by nurse?  She also stated her insurance company told her it was not necessary to complete.  I explained to her that I would send message to provider and ask for him to discuss scheduling/completing AWV at her visit December 06, 2020.

## 2020-11-28 ENCOUNTER — Other Ambulatory Visit: Payer: Self-pay

## 2020-11-28 DIAGNOSIS — R7309 Other abnormal glucose: Secondary | ICD-10-CM

## 2020-11-28 DIAGNOSIS — F5101 Primary insomnia: Secondary | ICD-10-CM

## 2020-11-28 DIAGNOSIS — R519 Headache, unspecified: Secondary | ICD-10-CM

## 2020-11-28 DIAGNOSIS — Z0000 Encounter for general adult medical examination without abnormal findings: Secondary | ICD-10-CM

## 2020-11-28 DIAGNOSIS — G8929 Other chronic pain: Secondary | ICD-10-CM

## 2020-11-28 DIAGNOSIS — E7800 Pure hypercholesterolemia, unspecified: Secondary | ICD-10-CM

## 2020-11-29 ENCOUNTER — Other Ambulatory Visit: Payer: PPO

## 2020-11-29 DIAGNOSIS — R519 Headache, unspecified: Secondary | ICD-10-CM | POA: Diagnosis not present

## 2020-11-29 DIAGNOSIS — R7309 Other abnormal glucose: Secondary | ICD-10-CM | POA: Diagnosis not present

## 2020-11-29 DIAGNOSIS — Z0000 Encounter for general adult medical examination without abnormal findings: Secondary | ICD-10-CM | POA: Diagnosis not present

## 2020-11-29 DIAGNOSIS — F5101 Primary insomnia: Secondary | ICD-10-CM | POA: Diagnosis not present

## 2020-11-29 DIAGNOSIS — E7800 Pure hypercholesterolemia, unspecified: Secondary | ICD-10-CM | POA: Diagnosis not present

## 2020-11-29 DIAGNOSIS — G8929 Other chronic pain: Secondary | ICD-10-CM | POA: Diagnosis not present

## 2020-11-30 ENCOUNTER — Other Ambulatory Visit: Payer: Self-pay

## 2020-11-30 LAB — COMPLETE METABOLIC PANEL WITH GFR
AG Ratio: 1.7 (calc) (ref 1.0–2.5)
ALT: 19 U/L (ref 6–29)
AST: 25 U/L (ref 10–35)
Albumin: 4.1 g/dL (ref 3.6–5.1)
Alkaline phosphatase (APISO): 68 U/L (ref 37–153)
BUN: 16 mg/dL (ref 7–25)
CO2: 28 mmol/L (ref 20–32)
Calcium: 9.1 mg/dL (ref 8.6–10.4)
Chloride: 105 mmol/L (ref 98–110)
Creat: 0.78 mg/dL (ref 0.60–1.00)
Globulin: 2.4 g/dL (calc) (ref 1.9–3.7)
Glucose, Bld: 83 mg/dL (ref 65–99)
Potassium: 4.5 mmol/L (ref 3.5–5.3)
Sodium: 140 mmol/L (ref 135–146)
Total Bilirubin: 0.4 mg/dL (ref 0.2–1.2)
Total Protein: 6.5 g/dL (ref 6.1–8.1)
eGFR: 79 mL/min/{1.73_m2} (ref >=60)

## 2020-11-30 LAB — CBC WITH DIFFERENTIAL/PLATELET
Absolute Monocytes: 431 cells/uL (ref 200–950)
Basophils Absolute: 41 cells/uL (ref 0–200)
Basophils Relative: 0.7 %
Eosinophils Absolute: 148 cells/uL (ref 15–500)
Eosinophils Relative: 2.5 %
HCT: 41.9 % (ref 35.0–45.0)
Hemoglobin: 14.4 g/dL (ref 11.7–15.5)
Lymphs Abs: 2354 cells/uL (ref 850–3900)
MCH: 33.5 pg — ABNORMAL HIGH (ref 27.0–33.0)
MCHC: 34.4 g/dL (ref 32.0–36.0)
MCV: 97.4 fL (ref 80.0–100.0)
MPV: 10.2 fL (ref 7.5–12.5)
Monocytes Relative: 7.3 %
Neutro Abs: 2926 cells/uL (ref 1500–7800)
Neutrophils Relative %: 49.6 %
Platelets: 241 10*3/uL (ref 140–400)
RBC: 4.3 10*6/uL (ref 3.80–5.10)
RDW: 12.5 % (ref 11.0–15.0)
Total Lymphocyte: 39.9 %
WBC: 5.9 10*3/uL (ref 3.8–10.8)

## 2020-11-30 LAB — LIPID PANEL
Cholesterol: 203 mg/dL — ABNORMAL HIGH (ref <200)
HDL: 77 mg/dL (ref >=50)
LDL Cholesterol (Calc): 108 mg/dL (calc) — ABNORMAL HIGH
Non-HDL Cholesterol (Calc): 126 mg/dL (calc) (ref <130)
Total CHOL/HDL Ratio: 2.6 (calc) (ref <5.0)
Triglycerides: 85 mg/dL (ref <150)

## 2020-11-30 LAB — TSH: TSH: 2.87 mIU/L (ref 0.40–4.50)

## 2020-11-30 LAB — HEMOGLOBIN A1C
Hgb A1c MFr Bld: 5.1 % of total Hgb (ref <5.7)
Mean Plasma Glucose: 100 mg/dL
eAG (mmol/L): 5.5 mmol/L

## 2020-12-06 ENCOUNTER — Encounter: Payer: Self-pay | Admitting: Family Medicine

## 2020-12-06 ENCOUNTER — Ambulatory Visit (INDEPENDENT_AMBULATORY_CARE_PROVIDER_SITE_OTHER): Payer: PPO | Admitting: Family Medicine

## 2020-12-06 ENCOUNTER — Other Ambulatory Visit: Payer: Self-pay | Admitting: Family Medicine

## 2020-12-06 ENCOUNTER — Other Ambulatory Visit: Payer: Self-pay

## 2020-12-06 VITALS — BP 123/40 | HR 63 | Ht 61.0 in | Wt 122.0 lb

## 2020-12-06 DIAGNOSIS — F4323 Adjustment disorder with mixed anxiety and depressed mood: Secondary | ICD-10-CM

## 2020-12-06 DIAGNOSIS — Z0000 Encounter for general adult medical examination without abnormal findings: Secondary | ICD-10-CM | POA: Diagnosis not present

## 2020-12-06 DIAGNOSIS — F5101 Primary insomnia: Secondary | ICD-10-CM

## 2020-12-06 DIAGNOSIS — E7800 Pure hypercholesterolemia, unspecified: Secondary | ICD-10-CM

## 2020-12-06 DIAGNOSIS — R7309 Other abnormal glucose: Secondary | ICD-10-CM

## 2020-12-06 NOTE — Assessment & Plan Note (Signed)
Suspected secondary to multiple factors including some sleep hygiene, overactive mind at night, history of adjustment with loss of loved ones but without clinical depression or anxiety - Not interested in medicines - Failed Melatonin, tried already  On Diazepam PRN

## 2020-12-06 NOTE — Progress Notes (Signed)
Subjective:    Patient ID: Kim Ayala, female    DOB: Mar 04, 1946, 75 y.o.   MRN: [id number]  Kim Ayala is a 75 y.o. female presenting on 12/06/2020 for Annual Exam   HPI  Here for Annual Physical and Lab Review.   Lifestyle / HYPERLIPIDEMIA - Reports no concerns. Stable lipids, LDL to 108, had improved still since 2 years ago - Not taking any cholesterol medication. Not interested. Lifestyle - Diet: improving - Using weight watchers program - Gym 3 x week   Adjustment Disorder depression / anxiety, Insomnia Prior history of anxiety / adjustment to COVID pandemic and insomnia Taking Diazepam (valium) as needed only, rarely took, takes with good results PRN for insomnia.   Headaches Tried Gabapentin failed, had side effect.     Health Maintenance: UTD on COVID19 Vaccine, 2nd booster done 11/2020, completed recent vaccine updated record.  DEXA - last done 2012. She plans to keep doing strengthening. She is not interested in repeating a DEXA scan.   Breast Cancer Screening - declines routine screening mammogram, last done 2012, declines despite counseling on benefits of cancer prevention.   Colon CA Screening - declined colonoscopy / cologuard despite counseling on benefits of cancer screening. She has had prior colonscopy >10 years ago by report, do not have this record, it was done by [facility], unable to receive copy of last record.   Depression screen [facility] 2/9 12/06/2020 07/19/2020 04/12/2020  Decreased Interest 0 0 0  Down, Depressed, Hopeless 1 0 0  PHQ - 2 Score 1 0 0  Altered sleeping 1 - -  Tired, decreased energy 1 - -  Change in appetite 0 - -  Feeling bad or failure about yourself  0 - -  Trouble concentrating 0 - -  Moving slowly or fidgety/restless 0 - -  Suicidal thoughts 0 - -  PHQ-9 Score 3 - -  Difficult doing work/chores Not difficult at all - -   GAD 7 : Generalized Anxiety Score 12/06/2020 11/04/2018 09/18/2018  Nervous, Anxious, on Edge 0  1 3  Control/stop worrying 0 1 3  Worry too much - different things 0 1 3  Trouble relaxing _0 Restless 0 0 0  Easily annoyed or irritable 0 1 0  Afraid - awful might happen _1 Total GAD 7 Score _2 Anxiety Difficulty Not difficult at all Not difficult at all Not difficult at all     Past Medical History:  Diagnosis Date   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   Past Surgical History:  Procedure Laterality Date   ABDOMINAL HYSTERECTOMY  1975   unsure of type   APPENDECTOMY  1975   CATARACT EXTRACTION  2009   [place]   bone   LAPAROSCOPY Bilateral 08/30/2014   salpingo-oophorectomy and adhesiolysis   RADICAL HYSTERECTOMY WITH TRANSPOSITION OF OVARIES     Social History   Socioeconomic History   Marital status: Divorced    Spouse name: Not on file   Number of children: Not on file   Years of education: Not on file   Highest education level: Not on file  Occupational History   Not on file  Tobacco Use   Smoking status: Never   Smokeless tobacco: Never  Substance and Sexual Activity   Alcohol use: No    Alcohol/week: 0.0 standard drinks   Drug use: No   Sexual activity: Not Currently  Other Topics  Concern   Not on file  Social History Narrative   Not on file   Social Determinants of Health   Financial Resource Strain: Not on file  Food Insecurity: Not on file  Transportation Needs: Not on file  Physical Activity: Not on file  Stress: Not on file  Social Connections: Not on file  Intimate Partner Violence: Not on file   Family History  Problem Relation Age of Onset   Heart disease Mother    Current Outpatient Medications on File Prior to Visit  Medication Sig   acetaminophen (TYLENOL) 500 MG tablet Take 2 tablets (1,000 mg total) by mouth every 8 (eight) hours as needed.   Calcium 150 MG TABS Take 150 mg by mouth daily.   diazepam (VALIUM) 5 MG tablet TAKE 1 TABLET BY MOUTH AT BEDTIME AS NEEDED  FOR ANXIETY   vitamin C (ASCORBIC ACID) 500 MG tablet    vitamin E 100 UNIT capsule Take by mouth daily. (Patient not taking: No sig reported)   No current facility-administered medications on file prior to visit.    Review of Systems  Constitutional:  Negative for activity change, appetite change, chills, diaphoresis, fatigue and fever.  HENT:  Negative for congestion and hearing loss.   Eyes:  Negative for visual disturbance.  Respiratory:  Negative for cough, chest tightness, shortness of breath and wheezing.   Cardiovascular:  Negative for chest pain, palpitations and leg swelling.  Gastrointestinal:  Negative for abdominal pain, constipation, diarrhea, nausea and vomiting.  Genitourinary:  Negative for dysuria, frequency and hematuria.  Musculoskeletal:  Negative for arthralgias and neck pain.  Skin:  Negative for rash.  Neurological:  Negative for dizziness, weakness, light-headedness, numbness and headaches.  Hematological:  Negative for adenopathy.  Psychiatric/Behavioral:  Negative for behavioral problems, dysphoric mood and sleep disturbance.   Per HPI unless specifically indicated above     Objective:    BP (!) 123/40   Pulse 63   Ht 5' 1" (1.549 m)   Wt 122 lb (55.3 kg)   SpO2 100%   BMI 23.05 kg/m   Wt Readings from Last 3 Encounters:  12/06/20 122 lb (55.3 kg)  09/16/20 122 lb (55.3 kg)  07/19/20 122 lb 9.6 oz (55.6 kg)    Physical Exam Vitals and nursing note reviewed.  Constitutional:      General: She is not in acute distress.    Appearance: She is well-developed. She is not diaphoretic.     Comments: Well-appearing, comfortable, cooperative  HENT:     Head: Normocephalic and atraumatic.  Eyes:     General:        Right eye: No discharge.        Left eye: No discharge.     Conjunctiva/sclera: Conjunctivae normal.     Pupils: Pupils are equal, round, and reactive to light.  Neck:     Thyroid: No thyromegaly.  Cardiovascular:     Rate and Rhythm:  Normal rate and regular rhythm.     Pulses: Normal pulses.     Heart sounds: Normal heart sounds. No murmur heard. Pulmonary:     Effort: Pulmonary effort is normal. No respiratory distress.     Breath sounds: Normal breath sounds. No wheezing or rales.  Abdominal:     General: Bowel sounds are normal. There is no distension.     Palpations: Abdomen is soft. There is no mass.     Tenderness: There is no abdominal tenderness.  Musculoskeletal:          General: No tenderness. Normal range of motion.     Cervical back: Normal range of motion and neck supple.     Right lower leg: No edema.     Left lower leg: No edema.     Comments: Upper / Lower Extremities: - Normal muscle tone, strength bilateral upper extremities 5/5, lower extremities 5/5  Lymphadenopathy:     Cervical: No cervical adenopathy.  Skin:    General: Skin is warm and dry.     Findings: No erythema or rash.  Neurological:     Mental Status: She is alert and oriented to person, place, and time.     Comments: Distal sensation intact to light touch all extremities  Psychiatric:        Mood and Affect: Mood normal.        Behavior: Behavior normal.        Thought Content: Thought content normal.     Comments: Well groomed, good eye contact, normal speech and thoughts     Results for orders placed or performed in visit on 11/28/20  TSH  Result Value Ref Range   TSH 2.87 0.40 - 4.50 mIU/L  Lipid panel  Result Value Ref Range   Cholesterol 203 (H) <200 mg/dL   HDL 77 > OR = 50 mg/dL   Triglycerides 85 <150 mg/dL   LDL Cholesterol (Calc) 108 (H) mg/dL (calc)   Total CHOL/HDL Ratio 2.6 <5.0 (calc)   Non-HDL Cholesterol (Calc) 126 <130 mg/dL (calc)  COMPLETE METABOLIC PANEL WITH GFR  Result Value Ref Range   Glucose, Bld 83 65 - 99 mg/dL   BUN 16 7 - 25 mg/dL   Creat 0.78 0.60 - 1.00 mg/dL   eGFR 79 > OR = 60 mL/min/1.73m2   BUN/Creatinine Ratio NOT APPLICABLE 6 - 22 (calc)   Sodium 140 135 - 146 mmol/L    Potassium 4.5 3.5 - 5.3 mmol/L   Chloride 105 98 - 110 mmol/L   CO2 28 20 - 32 mmol/L   Calcium 9.1 8.6 - 10.4 mg/dL   Total Protein 6.5 6.1 - 8.1 g/dL   Albumin 4.1 3.6 - 5.1 g/dL   Globulin 2.4 1.9 - 3.7 g/dL (calc)   AG Ratio 1.7 1.0 - 2.5 (calc)   Total Bilirubin 0.4 0.2 - 1.2 mg/dL   Alkaline phosphatase (APISO) 68 37 - 153 U/L   AST 25 10 - 35 U/L   ALT 19 6 - 29 U/L  CBC with Differential/Platelet  Result Value Ref Range   WBC 5.9 3.8 - 10.8 Thousand/uL   RBC 4.30 3.80 - 5.10 Million/uL   Hemoglobin 14.4 11.7 - 15.5 g/dL   HCT 41.9 35.0 - 45.0 %   MCV 97.4 80.0 - 100.0 fL   MCH 33.5 (H) 27.0 - 33.0 pg   MCHC 34.4 32.0 - 36.0 g/dL   RDW 12.5 11.0 - 15.0 %   Platelets 241 140 - 400 Thousand/uL   MPV 10.2 7.5 - 12.5 fL   Neutro Abs 2,926 1,500 - 7,800 cells/uL   Lymphs Abs 2,354 850 - 3,900 cells/uL   Absolute Monocytes 431 200 - 950 cells/uL   Eosinophils Absolute 148 15 - 500 cells/uL   Basophils Absolute 41 0 - 200 cells/uL   Neutrophils Relative % 49.6 %   Total Lymphocyte 39.9 %   Monocytes Relative 7.3 %   Eosinophils Relative 2.5 %   Basophils Relative 0.7 %  Hemoglobin A1c  Result Value Ref Range   Hgb A1c MFr   Bld 5.1 <5.7 % of total Hgb   Mean Plasma Glucose 100 mg/dL   eAG (mmol/L) 5.5 mmol/L      Assessment & Plan:   Problem List Items Addressed This Visit     Pure hypercholesterolemia    Controlled cholesterol on LDL 108, on improved lifestyle The 10-year ASCVD risk score Mikey Bussing DC Jr., et al., 2013) is: 16.9%   Plan: 1. No medication at this time, she defers for now will continue lifestyle management 2. Encourage improved lifestyle - low carb/cholesterol, reduce portion size, continue improving regular exercise       Insomnia    Suspected secondary to multiple factors including some sleep hygiene, overactive mind at night, history of adjustment with loss of loved ones but without clinical depression or anxiety - Not interested in medicines -  Failed Melatonin, tried already  On Diazepam PRN       Adjustment disorder with mixed anxiety and depressed mood   Other Visit Diagnoses     Annual physical exam    -  Primary       Updated Health Maintenance information Reviewed recent lab results with patient Encouraged improvement to lifestyle with diet and exercise Goal of weight loss   No orders of the defined types were placed in this encounter.   Follow up plan: Return in about 1 year (around 12/06/2021) for 1 year fasting lab only then 1 week later Annual Physical.  Future labs ordered.  [name], [facility] 12/06/2020, 2:16 PM

## 2020-12-06 NOTE — Patient Instructions (Addendum)
Thank you for coming to the office today.  Overall lab results are excellent  Cholesterol is stable from last year, LDL 108 from 106, similar. Keep on improving lifestyle, gym and trying to reduce your risk In future may reconsider statin medication.  Recent Labs    11/29/20 0935  HGBA1C 5.1   Keep on multivitamin  DUE for FASTING BLOOD WORK (no food or drink after midnight before the lab appointment, only water or coffee without cream/sugar on the morning of)  SCHEDULE "Lab Only" visit in the morning at the clinic for lab draw in 1 YEAR  - Make sure Lab Only appointment is at about 1 week before your next appointment, so that results will be available  For Lab Results, once available within 2-3 days of blood draw, you can can log in to MyChart online to view your results and a brief explanation. Also, we can discuss results at next follow-up visit.   Please schedule a Follow-up Appointment to: Return in about 1 year (around 12/06/2021) for 1 year fasting lab only then 1 week later Annual Physical.  If you have any other questions or concerns, please feel free to call the office or send a message through [place]. You may also schedule an earlier appointment if necessary.  Additionally, you may be receiving a survey about your experience at our office within a few days to 1 week by e-mail or mail. We value your feedback.  [name], DO [place]

## 2020-12-06 NOTE — Assessment & Plan Note (Signed)
Controlled cholesterol on LDL 108, on improved lifestyle The 10-year ASCVD risk score Mikey Bussing DC Jr., et al., 2013) is: 16.9%   Plan: 1. No medication at this time, she defers for now will continue lifestyle management 2. Encourage improved lifestyle - low carb/cholesterol, reduce portion size, continue improving regular exercise

## 2021-01-17 DIAGNOSIS — H26493 Other secondary cataract, bilateral: Secondary | ICD-10-CM | POA: Diagnosis not present

## 2021-02-13 ENCOUNTER — Other Ambulatory Visit: Payer: Self-pay

## 2021-02-13 ENCOUNTER — Ambulatory Visit (INDEPENDENT_AMBULATORY_CARE_PROVIDER_SITE_OTHER): Payer: PPO | Admitting: Family Medicine

## 2021-02-13 ENCOUNTER — Encounter: Payer: Self-pay | Admitting: Family Medicine

## 2021-02-13 VITALS — BP 116/50 | HR 61 | Ht 61.0 in | Wt 121.2 lb

## 2021-02-13 DIAGNOSIS — L820 Inflamed seborrheic keratosis: Secondary | ICD-10-CM | POA: Diagnosis not present

## 2021-02-13 NOTE — Patient Instructions (Addendum)
Thank you for coming to the office today.  Likely Seborrheic Keratosis (benign, non cancerous) but cannot 100% rule out other cause. Because it keeps swelling up and getting inflamed, would like Dermatology to look at it sooner. Referral was sent to Dr Nehemiah Massed.  Call them within 1-2 weeks if not heard back and schedule.   Please schedule a Follow-up Appointment to: Return if symptoms worsen or fail to improve.  If you have any other questions or concerns, please feel free to call the office or send a message through [place]. You may also schedule an earlier appointment if necessary.  Additionally, you may be receiving a survey about your experience at our office within a few days to 1 week by e-mail or mail. We value your feedback.  [name], DO [place]

## 2021-02-13 NOTE — Progress Notes (Signed)
Subjective:    Patient ID: Kim Ayala, female    DOB: 1946-02-17, 75 y.o.   MRN: [id number]  Kenzi Bardwell is a 75 y.o. female presenting on 02/13/2021 for Nevus   HPI  Neck Skin Lesion, Right Suspected Seborrheic Keratosis Reports 2-3 months now with inflamed pigmented skin raised lesion R side of neck x 2 spots, she has had some swelling inflammation of one of them and it was bothering her recently then it went down and did better, has come and gone now, she called Dermatology soonest apt 04/2021 2 months away. She has not had fevers chills, other skin lesions. No personal history of skin cancer melanoma.   Depression screen [facility] 2/9 12/06/2020 07/19/2020 04/12/2020  Decreased Interest 0 0 0  Down, Depressed, Hopeless 1 0 0  PHQ - 2 Score 1 0 0  Altered sleeping 1 - -  Tired, decreased energy 1 - -  Change in appetite 0 - -  Feeling bad or failure about yourself  0 - -  Trouble concentrating 0 - -  Moving slowly or fidgety/restless 0 - -  Suicidal thoughts 0 - -  PHQ-9 Score 3 - -  Difficult doing work/chores Not difficult at all - -    Social History   Tobacco Use   Smoking status: Never   Smokeless tobacco: Never  Substance Use Topics   Alcohol use: No    Alcohol/week: 0.0 standard drinks   Drug use: No    Review of Systems Per HPI unless specifically indicated above     Objective:    BP (!) 116/50   Pulse 61   Ht '5\' 1"'  (1.549 m)   Wt 121 lb 3.2 oz (55 kg)   SpO2 100%   BMI 22.90 kg/m   Wt Readings from Last 3 Encounters:  02/13/21 121 lb 3.2 oz (55 kg)  12/06/20 122 lb (55.3 kg)  09/16/20 122 lb (55.3 kg)    Physical Exam Vitals and nursing note reviewed.  Constitutional:      General: She is not in acute distress.    Appearance: Normal appearance. She is well-developed. She is not diaphoretic.     Comments: Well-appearing, comfortable, cooperative  HENT:     Head: Normocephalic and atraumatic.  Eyes:     General:        Right eye: No discharge.         Left eye: No discharge.     Conjunctiva/sclera: Conjunctivae normal.  Cardiovascular:     Rate and Rhythm: Normal rate.  Pulmonary:     Effort: Pulmonary effort is normal.  Skin:    General: Skin is warm and dry.     Findings: Lesion (Right lower neck x 2 lesions superior one is larger slightly inflamed SK with slight scab, one lower smaller appearance non inflamed.) present. No erythema or rash.  Neurological:     Mental Status: She is alert and oriented to person, place, and time.  Psychiatric:        Mood and Affect: Mood normal.        Behavior: Behavior normal.        Thought Content: Thought content normal.     Comments: Well groomed, good eye contact, normal speech and thoughts     Results for orders placed or performed in visit on 11/28/20  TSH  Result Value Ref Range   TSH 2.87 0.40 - 4.50 mIU/L  Lipid panel  Result Value Ref Range   Cholesterol 203 (H) <  200 mg/dL   HDL 77 > OR = 50 mg/dL   Triglycerides 85 <150 mg/dL   LDL Cholesterol (Calc) 108 (H) mg/dL (calc)   Total CHOL/HDL Ratio 2.6 <5.0 (calc)   Non-HDL Cholesterol (Calc) 126 <130 mg/dL (calc)  COMPLETE METABOLIC PANEL WITH GFR  Result Value Ref Range   Glucose, Bld 83 65 - 99 mg/dL   BUN 16 7 - 25 mg/dL   Creat 0.78 0.60 - 1.00 mg/dL   eGFR 79 > OR = 60 mL/min/1.6m   BUN/Creatinine Ratio NOT APPLICABLE 6 - 22 (calc)   Sodium 140 135 - 146 mmol/L   Potassium 4.5 3.5 - 5.3 mmol/L   Chloride 105 98 - 110 mmol/L   CO2 28 20 - 32 mmol/L   Calcium 9.1 8.6 - 10.4 mg/dL   Total Protein 6.5 6.1 - 8.1 g/dL   Albumin 4.1 3.6 - 5.1 g/dL   Globulin 2.4 1.9 - 3.7 g/dL (calc)   AG Ratio 1.7 1.0 - 2.5 (calc)   Total Bilirubin 0.4 0.2 - 1.2 mg/dL   Alkaline phosphatase (APISO) 68 37 - 153 U/L   AST 25 10 - 35 U/L   ALT 19 6 - 29 U/L  CBC with Differential/Platelet  Result Value Ref Range   WBC 5.9 3.8 - 10.8 Thousand/uL   RBC 4.30 3.80 - 5.10 Million/uL   Hemoglobin 14.4 11.7 - 15.5 g/dL   HCT 41.9 35.0  - 45.0 %   MCV 97.4 80.0 - 100.0 fL   MCH 33.5 (H) 27.0 - 33.0 pg   MCHC 34.4 32.0 - 36.0 g/dL   RDW 12.5 11.0 - 15.0 %   Platelets 241 140 - 400 Thousand/uL   MPV 10.2 7.5 - 12.5 fL   Neutro Abs 2,926 1,500 - 7,800 cells/uL   Lymphs Abs 2,354 850 - 3,900 cells/uL   Absolute Monocytes 431 200 - 950 cells/uL   Eosinophils Absolute 148 15 - 500 cells/uL   Basophils Absolute 41 0 - 200 cells/uL   Neutrophils Relative % 49.6 %   Total Lymphocyte 39.9 %   Monocytes Relative 7.3 %   Eosinophils Relative 2.5 %   Basophils Relative 0.7 %  Hemoglobin A1c  Result Value Ref Range   Hgb A1c MFr Bld 5.1 <5.7 % of total Hgb   Mean Plasma Glucose 100 mg/dL   eAG (mmol/L) 5.5 mmol/L      Assessment & Plan:   Problem List Items Addressed This Visit   None Visit Diagnoses     Inflamed seborrheic keratosis    -  Primary   Relevant Orders   Ambulatory referral to Dermatology       Recurrent raised pigmented lesion likely consistent with inflamed SK on R neck has non healing portion that is currently improved but has been episodic flaring up, also has other SK nearby non inflamed. I would request if patient could be seen earlier than 2-3 months given the recurrence and non healing portion of this inflamed lesion, likely SK but would request Derm eval.    Orders Placed This Encounter  Procedures   Ambulatory referral to Dermatology    Referral Priority:   Routine    Referral Type:   Consultation    Referral Reason:   Specialty Services Required    Requested Specialty:   Dermatology    Number of Visits Requested:   1     No orders of the defined types were placed in this encounter.     Follow  up plan: Return if symptoms worsen or fail to improve.  [name], [facility] 02/13/2021, 11:21 AM

## 2021-02-15 ENCOUNTER — Other Ambulatory Visit: Payer: Self-pay

## 2021-02-15 ENCOUNTER — Encounter: Payer: Self-pay | Admitting: Dermatology

## 2021-02-15 ENCOUNTER — Ambulatory Visit: Payer: PPO | Admitting: Dermatology

## 2021-02-15 DIAGNOSIS — L720 Epidermal cyst: Secondary | ICD-10-CM

## 2021-02-15 DIAGNOSIS — L820 Inflamed seborrheic keratosis: Secondary | ICD-10-CM | POA: Diagnosis not present

## 2021-02-15 DIAGNOSIS — L578 Other skin changes due to chronic exposure to nonionizing radiation: Secondary | ICD-10-CM

## 2021-02-15 NOTE — Progress Notes (Signed)
   New Patient Visit  Subjective  Kim Ayala is a 75 y.o. female who presents for the following: check spot (R neck, not sure how long she has had, itchy, tender/R breast, not sure how long it has been there, no symptoms ).  The following portions of the chart were reviewed this encounter and updated as appropriate:   Tobacco  Allergies  Meds  Problems  Med Hx  Surg Hx  Fam Hx     Review of Systems:  No other skin or systemic complaints except as noted in HPI or Assessment and Plan.  Objective  Well appearing patient in no apparent distress; mood and affect are within normal limits.  A focused examination was performed including neck, right breast. Relevant physical exam findings are noted in the Assessment and Plan.  R neck x 2, R proximal mandible x 1,  Total = 3 (3) Erythematous keratotic or waxy stuck-on papule or plaque.   R parasternal Cystic pap 0.7cm   Assessment & Plan  Inflamed seborrheic keratosis R neck x 2, R proximal mandible x 1,  Total = 3  Destruction of lesion - R neck x 2, R proximal mandible x 1,  Total = 3 Complexity: simple   Destruction method: cryotherapy   Informed consent: discussed and consent obtained   Timeout:  patient name, date of birth, surgical site, and procedure verified Lesion destroyed using liquid nitrogen: Yes   Region frozen until ice ball extended beyond lesion: Yes   Outcome: patient tolerated procedure well with no complications   Post-procedure details: wound care instructions given    Epidermal cyst R parasternal inflamed Benign, discussed excising Pt may schedule  Actinic Damage - chronic, secondary to cumulative UV radiation exposure/sun exposure over time - diffuse scaly erythematous macules with underlying dyspigmentation - Recommend daily broad spectrum sunscreen SPF 30+ to sun-exposed areas, reapply every 2 hours as needed.  - Recommend staying in the shade or wearing long sleeves, sun glasses (UVA+UVB  protection) and wide brim hats (4-inch brim around the entire circumference of the hat). - Call for new or changing lesions.  Return in about 4 months (around 06/18/2021) for surgery cyst R parasternal, TBSE.  I, Othelia Pulling, RMA, am acting as scribe for Sarina Ser, MD . Documentation: I have reviewed the above documentation for accuracy and completeness, and I agree with the above.  Sarina Ser, MD

## 2021-02-15 NOTE — Patient Instructions (Addendum)
If you have any questions or concerns for your doctor, please call our main line at [phone] and press option 4 to reach your doctor's medical assistant. If no one answers, please leave a voicemail as directed and we will return your call as soon as possible. Messages left after 4 pm will be answered the following business day.   You may also send us a message via MyChart. We typically respond to MyChart messages within 1-2 business days.  For prescription refills, please ask your pharmacy to contact our office. Our fax number is [phone].  If you have an urgent issue when the clinic is closed that cannot wait until the next business day, you can page your doctor at the number below.    Please note that while we do our best to be available for urgent issues outside of office hours, we are not available 24/7.   If you have an urgent issue and are unable to reach us, you may choose to seek medical care at your doctor's office, retail clinic, urgent care center, or emergency room.  If you have a medical emergency, please immediately call 911 or go to the emergency department.  Pager Numbers  - Dr. Kowalski: [phone]  - Dr. Moye: [phone]  - Dr. Stewart: [phone]  In the event of inclement weather, please call our main line at [phone] for an update on the status of any delays or closures.  Dermatology Medication Tips: Please keep the boxes that topical medications come in in order to help keep track of the instructions about where and how to use these. Pharmacies typically print the medication instructions only on the boxes and not directly on the medication tubes.   If your medication is too expensive, please contact our office at [phone] option 4 or send us a message through MyChart.   We are unable to tell what your co-pay for medications will be in advance as this is different depending on your insurance coverage. However, we may be able to find a substitute  medication at lower cost or fill out paperwork to get insurance to cover a needed medication.   If a prior authorization is required to get your medication covered by your insurance company, please allow us 1-2 business days to complete this process.  Drug prices often vary depending on where the prescription is filled and some pharmacies may offer cheaper prices.  The website www.goodrx.com contains coupons for medications through different pharmacies. The prices here do not account for what the cost may be with help from insurance (it may be cheaper with your insurance), but the website can give you the price if you did not use any insurance.  - You can print the associated coupon and take it with your prescription to the pharmacy.  - You may also stop by our office during regular business hours and pick up a GoodRx coupon card.  - If you need your prescription sent electronically to a different pharmacy, notify our office through [facility] MyChart or by phone at [phone] option 4.        Pre-Operative Instructions  You are scheduled for a surgical procedure at [facility]. We recommend you read the following instructions. If you have any questions or concerns, please call the office at [phone].  Shower and wash the entire body with soap and water the day of your surgery paying special attention to cleansing at and around the planned surgery site.  Avoid aspirin or aspirin containing products   at least fourteen (14) days prior to your surgical procedure and for at least one week (7 Days) after your surgical procedure. If you take aspirin on a regular basis for heart disease or history of stroke or for any other reason, we may recommend you continue taking aspirin but please notify us if you take this on a regular basis. Aspirin can cause more bleeding to occur during surgery as well as prolonged bleeding and bruising after surgery.   Avoid other nonsteroidal pain  medications at least one week prior to surgery and at least one week prior to your surgery. These include medications such as Ibuprofen (Motrin, Advil and Nuprin), Naprosyn, Voltaren, Relafen, etc. If medications are used for therapeutic reasons, please inform us as they can cause increased bleeding or prolonged bleeding during and bruising after surgical procedures.   Please advise us if you are taking any "blood thinner" medications such as Coumadin or Dipyridamole or Plavix or similar medications. These cause increased bleeding and prolonged bleeding during procedures and bruising after surgical procedures. We may have to consider discontinuing these medications briefly prior to and shortly after your surgery if safe to do so.   Please inform us of all medications you are currently taking. All medications that are taken regularly should be taken the day of surgery as you always do. Nevertheless, we need to be informed of what medications you are taking prior to surgery to know whether they will affect the procedure or cause any complications.   Please inform us of any medication allergies. Also inform us of whether you have allergies to Latex or rubber products or whether you have had any adverse reaction to Lidocaine or Epinephrine.  Please inform us of any prosthetic or artificial body parts such as artificial heart valve, joint replacements, etc., or similar condition that might require preoperative antibiotics.   We recommend avoidance of alcohol at least two weeks prior to surgery and continued avoidance for at least two weeks after surgery.   We recommend discontinuation of tobacco smoking at least two weeks prior to surgery and continued abstinence for at least two weeks after surgery.  Do not plan strenuous exercise, strenuous work or strenuous lifting for approximately four weeks after your surgery.   We request if you are unable to make your scheduled surgical appointment, please call us  at least a week in advance or as soon as you are aware of a problem so that we can cancel or reschedule the appointment.   You MAY TAKE TYLENOL (acetaminophen) for pain as it is not a blood thinner.   PLEASE PLAN TO BE IN TOWN FOR TWO WEEKS FOLLOWING SURGERY, THIS IS IMPORTANT SO YOU CAN BE CHECKED FOR DRESSING CHANGES, SUTURE REMOVAL AND TO MONITOR FOR POSSIBLE COMPLICATIONS.  

## 2021-04-18 ENCOUNTER — Ambulatory Visit: Payer: Self-pay

## 2021-04-18 NOTE — Telephone Encounter (Signed)
Pt called, states she has fever of 99.6, runny nose and head congestion for 2 days now. Pt asked if medication can be called into pharmacy. I advised pt since she has symptoms and fever we would need to do a visit with her. D/t transportation, scheduled telephone visit tomorrow 04/19/21 at 1120 with Rollene Fare, NP. Pt says she is taking Tylenol for her fever but she just feels so week. Cough is nonproductive. I advised pt I will send my message over to Dr. Raliegh Ip and if medication to be called in, we will reach out to her. For appt tomorrow call pt on [phone]. Care advice given and pt verbalized understanding. No other questions/concerns noted.      Message from [place] sent at 04/18/2021  9:49 AM EST  Patient called in states temp is 99.5 and she has a scratchy throat, wants to know if she can be sent anitibiotics to [place], [place].  Phone: [phone]  Fax: [phone]   Reason for Disposition  [1] Fever returns after gone for over 24 hours AND [2] symptoms worse or not improved  Answer Assessment - Initial Assessment Questions 1. ONSET: "When did the nasal discharge start?"      2 days ago,  2. AMOUNT: "How much discharge is there?"      Runny nose 3. COUGH: "Do you have a cough?" If yes, ask: "Describe the color of your sputum" (clear, white, yellow, green)     Yes, nonproductive  4. RESPIRATORY DISTRESS: "Describe your breathing."      No 5. FEVER: "Do you have a fever?" If Yes, ask: "What is your temperature, how was it measured, and when did it start?"     99.6 6. SEVERITY: "Overall, how bad are you feeling right now?" (e.g., doesn't interfere with normal activities, staying home from school/work, staying in bed)      Moderate  7. OTHER SYMPTOMS: "Do you have any other symptoms?" (e.g., sore throat, earache, wheezing, vomiting)     Head stopped up and sore throat  8. PREGNANCY: "Is there any chance you are pregnant?" "When was your last  menstrual period?"     No  Protocols used: Common Cold-A-AH

## 2021-04-19 ENCOUNTER — Encounter: Payer: Self-pay | Admitting: Internal Medicine

## 2021-04-19 ENCOUNTER — Other Ambulatory Visit: Payer: Self-pay

## 2021-04-19 ENCOUNTER — Ambulatory Visit (INDEPENDENT_AMBULATORY_CARE_PROVIDER_SITE_OTHER): Payer: PPO | Admitting: Internal Medicine

## 2021-04-19 VITALS — Temp 99.5°F

## 2021-04-19 DIAGNOSIS — J069 Acute upper respiratory infection, unspecified: Secondary | ICD-10-CM | POA: Diagnosis not present

## 2021-04-19 NOTE — Progress Notes (Signed)
Virtual Visit via Telephone Note  I connected with Kim Ayala on 04/19/21 at 11:20 AM EST by telephone and verified that I am speaking with the correct person using two identifiers.  Location: Patient: Home Provider: Office  Person's participating in this telephone call: Webb Silversmith, NP-C and Germain Osgood.   I discussed the limitations, risks, security and privacy concerns of performing an evaluation and management service by telephone and the availability of in person appointments. I also discussed with the patient that there may be a patient responsible charge related to this service. The patient expressed understanding and agreed to proceed.   History of Present Illness:  Patient reports fatigue, runny nose, sore throat and cough.  She reports this started 4 days ago.  She is blowing clear mucus out of her nose.  She denies difficulty swallowing.  The cough is nonproductive.  She reports fever up to 100.5 at home.  She has had chills and body aches.  She denies headache, nasal congestion, ear pain, shortness of breath, nausea, vomiting or diarrhea.  She has not taken any medications OTC for this.  She has not had sick contacts with similar symptoms.  She is up-to-date on her flu and COVID vaccines.    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 30 tablet 0   Calcium 150 MG TABS Take 150 mg by mouth daily.     diazepam (VALIUM) 5 MG tablet TAKE 1 TABLET BY MOUTH AT BEDTIME AS NEEDED FOR ANXIETY 30 tablet 2   vitamin C (ASCORBIC ACID) 500 MG tablet      vitamin E 100 UNIT capsule Take by mouth daily.     No current facility-administered medications for this visit.    Allergies  Allergen Reactions   Asa [Aspirin]     Family History  Problem Relation Age of  Onset   Heart disease Mother     Social History   Socioeconomic History   Marital status: Divorced    Spouse name: Not on file   Number of children: Not on file   Years of education: Not on file   Highest education level: Not on file  Occupational History   Not on file  Tobacco Use   Smoking status: Never   Smokeless tobacco: Never  Substance and Sexual Activity   Alcohol use: No    Alcohol/week: 0.0 standard drinks   Drug use: No   Sexual activity: Not Currently  Other Topics Concern   Not on file  Social History Narrative   Not on file   Social Determinants of Health   Financial Resource Strain: Not on file  Food Insecurity: Not on file  Transportation Needs: Not on file  Physical Activity: Not on file  Stress: Not on file  Social Connections: Not on file  Intimate Partner Violence: Not on file     Constitutional: Patient reports fatigue, fever, chills and body aches.  Denies malaise, headache or abrupt weight changes.  HEENT: Patient reports runny nose and sore throat.  Denies eye pain, eye redness, ear pain, ringing in the ears, wax buildup, nasal congestion, bloody nose Respiratory: Patient reports cough.  Denies difficulty breathing, shortness of breath, or sputum production.   Cardiovascular: Denies chest pain, chest tightness, palpitations or swelling in the hands or feet.  No other specific complaints in a complete review of systems (except as listed in HPI above).  Observations/Objective: Temp 99.5 F (37.5 C) (Temporal)  Wt Readings from Last 3 Encounters:  02/13/21 121 lb 3.2 oz (55 kg)  12/06/20 122 lb (55.3 kg)  09/16/20 122 lb (55.3 kg)    General: In NAD. HEENT: Nose: no congestion noted; Throat/Mouth: hoarseness noted. Pulmonary/Chest: Normal effort. No respiratory distress.  Neurological: Alert and oriented.   BMET    Component Value Date/Time   NA 140 11/29/2020 0935   NA 143 09/23/2015 0822   NA 142 08/26/2014 1314   K 4.5  11/29/2020 0935   K 3.6 08/26/2014 1314   CL 105 11/29/2020 0935   CL 107 08/26/2014 1314   CO2 28 11/29/2020 0935   CO2 29 08/26/2014 1314   GLUCOSE 83 11/29/2020 0935   GLUCOSE 95 08/26/2014 1314   BUN 16 11/29/2020 0935   BUN 16 09/23/2015 0822   BUN 19 08/26/2014 1314   CREATININE 0.78 11/29/2020 0935   CALCIUM 9.1 11/29/2020 0935   CALCIUM 9.0 08/26/2014 1314   GFRNONAA 73 11/02/2019 0928   GFRAA 84 11/02/2019 0928    Lipid Panel     Component Value Date/Time   CHOL 203 (H) 11/29/2020 0935   CHOL 162 09/23/2015 0822   TRIG 85 11/29/2020 0935   HDL 77 11/29/2020 0935   HDL 59 09/23/2015 0822   CHOLHDL 2.6 11/29/2020 0935   VLDL 25 07/24/2016 0001   LDLCALC 108 (H) 11/29/2020 0935    CBC    Component Value Date/Time   WBC 5.9 11/29/2020 0935   RBC 4.30 11/29/2020 0935   HGB 14.4 11/29/2020 0935   HGB 14.7 09/23/2015 0822   HCT 41.9 11/29/2020 0935   HCT 41.6 09/23/2015 0822   PLT 241 11/29/2020 0935   PLT 243 09/23/2015 0822   MCV 97.4 11/29/2020 0935   MCV 93 09/23/2015 0822   MCV 96 08/26/2014 1314   MCH 33.5 (H) 11/29/2020 0935   MCHC 34.4 11/29/2020 0935   RDW 12.5 11/29/2020 0935   RDW 13.2 09/23/2015 0822   RDW 12.8 08/26/2014 1314   LYMPHSABS 2,354 11/29/2020 0935   LYMPHSABS 2.6 09/23/2015 0822   MONOABS 427 07/24/2016 0001   EOSABS 148 11/29/2020 0935   EOSABS 0.4 09/23/2015 0822   BASOSABS 41 11/29/2020 0935   BASOSABS 0.0 09/23/2015 0822    Hgb A1C Lab Results  Component Value Date   HGBA1C 5.1 11/29/2020        Assessment and Plan:  Viral URI with Cough:  Discussed possibility of coming in to have her flu tested however she is outside the window for the antiviral therapy so will not change the treatment plan at this time Encourage rest and fluids Advised her to start Allegra 180 mg 2 times daily for the first 3 days then daily thereafter however she reports that she will not take any over-the-counter medications Offered to  prescribe her Tessalon Perles or cough syrup which she also declines She is requesting an antibiotic but I advised her that this is viral and antibiotics are not indicated at this time She was unhappy with the recommendations given and states she will follow-up in the office with her PCP  Follow Up Instructions:    I discussed the assessment and treatment plan with the patient. The patient was provided an opportunity to ask questions and all were answered. The patient agreed with the plan and demonstrated an understanding of  the instructions.   The patient was advised to call back or seek an in-person evaluation if the symptoms worsen or if the condition fails to improve as anticipated.  I provided 5:33 minutes of non-face-to-face time during this encounter.   Webb Silversmith, NP

## 2021-04-19 NOTE — Patient Instructions (Signed)

## 2021-05-01 ENCOUNTER — Ambulatory Visit: Payer: Commercial Managed Care - HMO | Admitting: Dermatology

## 2021-05-22 ENCOUNTER — Other Ambulatory Visit: Payer: Self-pay | Admitting: Family Medicine

## 2021-05-22 DIAGNOSIS — F5101 Primary insomnia: Secondary | ICD-10-CM

## 2021-05-22 DIAGNOSIS — F4323 Adjustment disorder with mixed anxiety and depressed mood: Secondary | ICD-10-CM

## 2021-05-22 NOTE — Telephone Encounter (Signed)
Requested medication (s) are due for refill today: yes  Requested medication (s) are on the active medication list: yes  Last refill:  03/29/21  Future visit scheduled: no  Notes to clinic:  This medication can not be delegated, please assess.  Requested Prescriptions  Pending Prescriptions Disp Refills   diazepam (VALIUM) 5 MG tablet [Pharmacy Med Name: DIAZEPAM 5 MG TAB] 30 tablet     Sig: TAKE 1 TABLET BY MOUTH AT BEDTIME AS NEEDED FOR ANXIETY     Not Delegated - Psychiatry:  Anxiolytics/Hypnotics Failed - 05/22/2021  2:46 PM      Failed - This refill cannot be delegated      Failed - Urine Drug Screen completed in last 360 days      Passed - Valid encounter within last 6 months    Recent Outpatient Visits           1 month ago Viral URI with cough   [facility] [place], Coralie Keens, NP   3 months ago Inflamed seborrheic keratosis   [place], DO   5 months ago Annual physical exam   [facility] O[place] Hauser, DO   8 months ago Acute viral syndrome   [place], DO   10 months ago Chronic nonintractable headache, unspecified headache type   [place], Devonne Doughty, DO

## 2021-05-25 DIAGNOSIS — H18413 Arcus senilis, bilateral: Secondary | ICD-10-CM | POA: Diagnosis not present

## 2021-05-25 DIAGNOSIS — H02831 Dermatochalasis of right upper eyelid: Secondary | ICD-10-CM | POA: Diagnosis not present

## 2021-05-25 DIAGNOSIS — H26493 Other secondary cataract, bilateral: Secondary | ICD-10-CM | POA: Diagnosis not present

## 2021-05-25 DIAGNOSIS — Z961 Presence of intraocular lens: Secondary | ICD-10-CM | POA: Diagnosis not present

## 2021-05-25 DIAGNOSIS — H26491 Other secondary cataract, right eye: Secondary | ICD-10-CM | POA: Diagnosis not present

## 2021-05-30 DIAGNOSIS — H26491 Other secondary cataract, right eye: Secondary | ICD-10-CM | POA: Diagnosis not present

## 2021-06-13 DIAGNOSIS — H26492 Other secondary cataract, left eye: Secondary | ICD-10-CM | POA: Diagnosis not present

## 2021-06-19 DIAGNOSIS — H26492 Other secondary cataract, left eye: Secondary | ICD-10-CM | POA: Diagnosis not present

## 2021-08-10 ENCOUNTER — Other Ambulatory Visit: Payer: Self-pay | Admitting: Family Medicine

## 2021-08-10 DIAGNOSIS — F4323 Adjustment disorder with mixed anxiety and depressed mood: Secondary | ICD-10-CM

## 2021-08-10 DIAGNOSIS — F5101 Primary insomnia: Secondary | ICD-10-CM

## 2021-08-11 NOTE — Telephone Encounter (Signed)
Requested medication (s) are due for refill today- no ? ?Requested medication (s) are on the active medication list -yes ? ?Future visit scheduled -no ? ?Last refill: 05/23/21 #30 2RF ? ?Notes to clinic: Request RF: non delegated Rx ? ?Requested Prescriptions  ?Pending Prescriptions Disp Refills  ? diazepam (VALIUM) 5 MG tablet [Pharmacy Med Name: DIAZEPAM 5 MG TAB] 30 tablet   ?  Sig: TAKE 1 TABLET BY MOUTH AT BEDTIME AS NEEDED FOR ANXIETY  ?  ? Not Delegated - Psychiatry: Anxiolytics/Hypnotics 2 Failed - 08/10/2021 12:18 PM  ?  ?  Failed - This refill cannot be delegated  ?  ?  Failed - Urine Drug Screen completed in last 360 days  ?  ?  Passed - Patient is not pregnant  ?  ?  Passed - Valid encounter within last 6 months  ?  Recent Outpatient Visits   ? ?      ? 3 months ago Viral URI with cough  ? [facility] [place], [place] W, NP  ? 5 months ago Inflamed seborrheic keratosis  ? [place], DO  ? 8 months ago Annual physical exam  ? [place], DO  ? 10 months ago Acute viral syndrome  ? [place], DO  ? 1 year ago Chronic nonintractable headache, unspecified headache type  ? [place], DO  ? ?  ?  ? ?  ?  ?  ? ? ? ?Requested Prescriptions  ?Pending Prescriptions Disp Refills  ? diazepam (VALIUM) 5 MG tablet [Pharmacy Med Name: DIAZEPAM 5 MG TAB] 30 tablet   ?  Sig: TAKE 1 TABLET BY MOUTH AT BEDTIME AS NEEDED FOR ANXIETY  ?  ? Not Delegated - Psychiatry: Anxiolytics/Hypnotics 2 Failed - 08/10/2021 12:18 PM  ?  ?  Failed - This refill cannot be delegated  ?  ?  Failed - Urine Drug Screen completed in last 360 days  ?  ?  Passed - Patient is not pregnant  ?  ?  Passed - Valid encounter within last 6 months  ?  Recent Outpatient Visits   ? ?      ? 3 months ago Viral URI with cough  ? [facility] [place], [place] W, NP  ? 5  months ago Inflamed seborrheic keratosis  ? [place], DO  ? 8 months ago Annual physical exam  ? [place], DO  ? 10 months ago Acute viral syndrome  ? [place], DO  ? 1 year ago Chronic nonintractable headache, unspecified headache type  ? [place], DO  ? ?  ?  ? ?  ?  ?  ? ? ? ?

## 2021-08-17 ENCOUNTER — Ambulatory Visit (INDEPENDENT_AMBULATORY_CARE_PROVIDER_SITE_OTHER): Payer: PPO | Admitting: Family Medicine

## 2021-08-17 ENCOUNTER — Encounter: Payer: Self-pay | Admitting: Family Medicine

## 2021-08-17 VITALS — Ht 61.0 in | Wt 121.0 lb

## 2021-08-17 DIAGNOSIS — R519 Headache, unspecified: Secondary | ICD-10-CM | POA: Diagnosis not present

## 2021-08-17 DIAGNOSIS — H9202 Otalgia, left ear: Secondary | ICD-10-CM

## 2021-08-17 MED ORDER — FLUTICASONE PROPIONATE 50 MCG/ACT NA SUSP: 2.0000 | Freq: Every day | NASAL | 3 refills | Status: DC

## 2021-08-17 MED ORDER — PREDNISONE 10 MG PO TABS: ORAL_TABLET | ORAL | 0 refills | Status: DC

## 2021-08-17 NOTE — Patient Instructions (Signed)
Trial on Prednisone taper 6 days ?Start nasal steroid Flonase 2 sprays in each nostril daily for 4-6 weeks, may repeat course seasonally or as needed ?Tylenol PRN inc dose ?F/u with dentist ? ?Follow-up if unresolved. ? ?Please schedule a Follow-up Appointment to: No follow-ups on file. ? ?If you have any other questions or concerns, please feel free to call the office or send a message through [place]. You may also schedule an earlier appointment if necessary. ? ?Additionally, you may be receiving a survey about your experience at our office within a few days to 1 week by e-mail or mail. We value your feedback. ? ?[name], DO ?[place] ?

## 2021-08-17 NOTE — Progress Notes (Signed)
Virtual Visit via Telephone ?The purpose of this virtual visit is to provide medical care while limiting exposure to the novel coronavirus (COVID19) for both patient and office staff. ? ?Consent was obtained for phone visit:  Yes.   ?Answered questions that patient had about telehealth interaction:  Yes.   ?I discussed the limitations, risks, security and privacy concerns of performing an evaluation and management service by telephone. I also discussed with the patient that there may be a patient responsible charge related to this service. The patient expressed understanding and agreed to proceed. ? ?Patient Location: Home ?Provider Location: [facility] (Office) ? ?Participants in virtual visit: ?- Patient: Jackelyn Illingworth ?- CMA: Orinda Kenner, CMA ?- Provider: Dr Parks Ranger ? ?---------------------------------------------------------------------- ?Chief Complaint  ?Patient presents with  ? Headache  ? ? ?S: Reviewed CMA documentation. I have called patient and gathered additional HPI as follows: ? ?Headache ?Past history same problem 10/2019 see below: ?- previous history of sharp stabbing "ice pick" stabbing pain moderate to severe pain constant episodes frequently throughout the day. She has not been able to rest well while having this headache. Tried Tylenol Extra Str '500mg'$  x 1 dose slight reduction in pain. ?History Migraines years ago. Previously back in 1980s, triggered by cigar smoke ? ?Currently symptoms present for past 1 week with sharp episodic symptoms headache, mostly pain left side sharp pain behind ear radiating into neck. Episodic lasting seconds. ?Previously failed Gabapentin in 2021 due to side effect. Her prior headache resolved at that time thought due to dental work. ?She had recent dental work and root canal / bridge work on same side L ?No migraine symptoms ?Denies fever chills congestion drainage, nausea vomiting ? ? ?Past Medical History:  ?Diagnosis Date  ?  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 ?Social History  ? ?Tobacco Use  ? Smoking status: Never  ? Smokeless tobacco: Never  ?Substance Use Topics  ? Alcohol use: No  ?  Alcohol/week: 0.0 standard drinks  ? Drug use: No  ? ? ?Current Outpatient Medications:  ?  acetaminophen (TYLENOL) 500 MG tablet, Take 2 tablets (1,000 mg total) by mouth every 8 (eight) hours as needed., Disp: 30 tablet, Rfl: 0 ?  Calcium 150 MG TABS, Take 150 mg by mouth daily., Disp: , Rfl:  ?  diazepam (VALIUM) 5 MG tablet, TAKE 1 TABLET BY MOUTH AT BEDTIME AS NEEDED FOR ANXIETY, Disp: 30 tablet, Rfl: 2 ?  fluticasone (FLONASE) 50 MCG/ACT nasal spray, Place 2 sprays into both nostrils daily. Use for 4-6 weeks then stop and use seasonally or as needed., Disp: 16 g, Rfl: 3 ?  predniSONE (DELTASONE) 10 MG tablet, Take 6 tabs with breakfast Day 1, 5 tabs Day 2, 4 tabs Day 3, 3 tabs Day 4, 2 tabs Day 5, 1 tab Day 6., Disp: 21 tablet, Rfl: 0 ?  vitamin C (ASCORBIC ACID) 500 MG tablet, , Disp: , Rfl:  ?  vitamin E 100 UNIT capsule, Take by mouth daily., Disp: , Rfl:  ? ? ?  12/06/2020  ?  2:09 PM 07/19/2020  ?  3:30 PM 04/12/2020  ?  4:01 PM  ?Depression screen PHQ 2/9  ?Decreased Interest 0 0 0  ?Down, Depressed, Hopeless 1 0 0  ?PHQ - 2 Score 1 0 0  ?Altered sleeping 1    ?Tired, decreased energy 1    ?Change in appetite 0    ?Feeling bad or failure about yourself  0    ?Trouble concentrating 0    ?  Moving slowly or fidgety/restless 0    ?Suicidal thoughts 0    ?PHQ-9 Score 3    ?Difficult doing work/chores Not difficult at all    ? ? ? ?  12/06/2020  ?  2:10 PM 11/04/2018  ?  5:10 PM 09/18/2018  ?  8:41 AM  ?GAD 7 : Generalized Anxiety Score  ?Nervous, Anxious, on Edge 0 1 3  ?Control/stop worrying 0 1 3  ?Worry too much - different things 0 1 3  ?Trouble relaxing '1 1 3  '$ ?Restless 0 0 0  ?Easily annoyed or irritable 0 1 0  ?Afraid - awful might happen '1 1 3  '$ ?Total GAD 7 Score '2 6 15  '$ ?Anxiety Difficulty Not difficult at all Not difficult at all  Not difficult at all  ? ? ?-------------------------------------------------------------------------- ?O: No physical exam performed due to remote telephone encounter. ? ?Lab results reviewed. ? ?No results found for this or any previous visit (from the past 2160 hour(s)). ? ?-------------------------------------------------------------------------- ?A&P: ? ?Problem List Items Addressed This Visit   ?None ?Visit Diagnoses   ? ? Sinus headache    -  Primary  ? Relevant Medications  ? fluticasone (FLONASE) 50 MCG/ACT nasal spray  ? predniSONE (DELTASONE) 10 MG tablet  ? Left ear pain      ? Relevant Medications  ? fluticasone (FLONASE) 50 MCG/ACT nasal spray  ? predniSONE (DELTASONE) 10 MG tablet  ? ?  ? ?Unclear exact etiology of localized sharp episodic headache, atypical pattern ?Seems to be most likely sinus vs eustachian tube vs dental work / root canal on that side ?Similar to prior 2021-2022 episode, that was self limited. ?Failed Gabapentin ?No acute symptoms of infection ? ?Trial on Prednisone taper 6 days ?Start nasal steroid Flonase 2 sprays in each nostril daily for 4-6 weeks, may repeat course seasonally or as needed ?Tylenol PRN inc dose ?F/u with dentist ? ?Follow-up if unresolved. ? ?Meds ordered this encounter  ?Medications  ? fluticasone (FLONASE) 50 MCG/ACT nasal spray  ?  Sig: Place 2 sprays into both nostrils daily. Use for 4-6 weeks then stop and use seasonally or as needed.  ?  Dispense:  16 g  ?  Refill:  3  ? predniSONE (DELTASONE) 10 MG tablet  ?  Sig: Take 6 tabs with breakfast Day 1, 5 tabs Day 2, 4 tabs Day 3, 3 tabs Day 4, 2 tabs Day 5, 1 tab Day 6.  ?  Dispense:  21 tablet  ?  Refill:  0  ? ? ?Follow-up: ?- Return in PRN ? ?Patient verbalizes understanding with the above medical recommendations including the limitation of remote medical advice. ? ?Specific follow-up and call-back criteria were given for patient to follow-up or seek medical care more urgently if needed. ? ? ?- Time spent  in direct consultation with patient on phone: 12 minutes ? ? ?[name], DO ?[facility] ?[facility] ?08/17/2021, 10:33 AM ? ?

## 2021-08-22 ENCOUNTER — Ambulatory Visit (INDEPENDENT_AMBULATORY_CARE_PROVIDER_SITE_OTHER): Payer: PPO | Admitting: Family Medicine

## 2021-08-22 ENCOUNTER — Other Ambulatory Visit: Payer: Self-pay | Admitting: Family Medicine

## 2021-08-22 ENCOUNTER — Encounter: Payer: Self-pay | Admitting: Family Medicine

## 2021-08-22 VITALS — BP 122/60 | HR 84 | Ht 61.0 in | Wt 121.0 lb

## 2021-08-22 DIAGNOSIS — R197 Diarrhea, unspecified: Secondary | ICD-10-CM | POA: Diagnosis not present

## 2021-08-23 ENCOUNTER — Encounter: Payer: Self-pay | Admitting: Family Medicine

## 2021-08-23 NOTE — Progress Notes (Signed)
? ?Subjective:  ? ? Patient ID: Kim Ayala, female    DOB: 1946-03-07, 76 y.o.   MRN: [id number] ? ?Kim Ayala is a 76 y.o. female presenting on 08/22/2021 for Diarrhea ? ? ?HPI ? ?Diarrhea ?Acute onset, 4 days ago started with loose stool diarrhea, attributed to prednisone side effect. Was on for her headache now improved. Not having blood in stool. Denies nausea or vomiting. Denies fever or chills or abdominal pain. Has tried imodium OTC without significant results. Tolerating PO liquids water mostly. Not eating solid food. Feels more tired and weak today ? ? ?  12/06/2020  ?  2:09 PM 07/19/2020  ?  3:30 PM 04/12/2020  ?  4:01 PM  ?Depression screen PHQ 2/9  ?Decreased Interest 0 0 0  ?Down, Depressed, Hopeless 1 0 0  ?PHQ - 2 Score 1 0 0  ?Altered sleeping 1    ?Tired, decreased energy 1    ?Change in appetite 0    ?Feeling bad or failure about yourself  0    ?Trouble concentrating 0    ?Moving slowly or fidgety/restless 0    ?Suicidal thoughts 0    ?PHQ-9 Score 3    ?Difficult doing work/chores Not difficult at all    ? ? ?Social History  ? ?Tobacco Use  ? Smoking status: Never  ? Smokeless tobacco: Never  ?Substance Use Topics  ? Alcohol use: No  ?  Alcohol/week: 0.0 standard drinks  ? Drug use: No  ? ? ?Review of Systems ?Per HPI unless specifically indicated above ? ?   ?Objective:  ?  ?BP 122/60   Pulse 84   Ht _0  (1.549 m)   Wt 121 lb (54.9 kg)   SpO2 98%   BMI 22.86 kg/m?   ?Wt Readings from Last 3 Encounters:  ?08/22/21 121 lb (54.9 kg)  ?08/17/21 121 lb (54.9 kg)  ?02/13/21 121 lb 3.2 oz (55 kg)  ?  ?Physical Exam ?Vitals and nursing note reviewed.  ?Constitutional:   ?   General: She is not in acute distress. ?   Appearance: She is well-developed. She is not diaphoretic.  ?   Comments: Well-appearing, comfortable, cooperative  ?HENT:  ?   Head: Normocephalic and atraumatic.  ?Eyes:  ?   General:     ?   Right eye: No discharge.     ?   Left eye: No discharge.  ?   Conjunctiva/sclera: Conjunctivae  normal.  ?Neck:  ?   Thyroid: No thyromegaly.  ?Cardiovascular:  ?   Rate and Rhythm: Normal rate and regular rhythm.  ?   Heart sounds: Normal heart sounds. No murmur heard. ?   Comments: Normal cap refill ?Pulmonary:  ?   Effort: Pulmonary effort is normal. No respiratory distress.  ?   Breath sounds: Normal breath sounds. No wheezing or rales.  ?Musculoskeletal:     ?   General: Normal range of motion.  ?   Cervical back: Normal range of motion and neck supple.  ?Lymphadenopathy:  ?   Cervical: No cervical adenopathy.  ?Skin: ?   General: Skin is warm and dry.  ?   Findings: No erythema or rash.  ?Neurological:  ?   Mental Status: She is alert and oriented to person, place, and time.  ?Psychiatric:     ?   Behavior: Behavior normal.  ?   Comments: Well groomed, good eye contact, normal speech and thoughts  ? ? ? ? ?Results for orders placed or  performed in visit on 11/28/20  ?TSH  ?Result Value Ref Range  ? TSH 2.87 0.40 - 4.50 mIU/L  ?Lipid panel  ?Result Value Ref Range  ? Cholesterol 203 (H) <200 mg/dL  ? HDL 77 > OR = 50 mg/dL  ? Triglycerides 85 <150 mg/dL  ? LDL Cholesterol (Calc) 108 (H) mg/dL (calc)  ? Total CHOL/HDL Ratio 2.6 <5.0 (calc)  ? Non-HDL Cholesterol (Calc) 126 <130 mg/dL (calc)  ?COMPLETE METABOLIC PANEL WITH GFR  ?Result Value Ref Range  ? Glucose, Bld 83 65 - 99 mg/dL  ? BUN 16 7 - 25 mg/dL  ? Creat 0.78 0.60 - 1.00 mg/dL  ? eGFR 79 > OR = 60 mL/min/1.58m  ? BUN/Creatinine Ratio NOT APPLICABLE 6 - 22 (calc)  ? Sodium 140 135 - 146 mmol/L  ? Potassium 4.5 3.5 - 5.3 mmol/L  ? Chloride 105 98 - 110 mmol/L  ? CO2 28 20 - 32 mmol/L  ? Calcium 9.1 8.6 - 10.4 mg/dL  ? Total Protein 6.5 6.1 - 8.1 g/dL  ? Albumin 4.1 3.6 - 5.1 g/dL  ? Globulin 2.4 1.9 - 3.7 g/dL (calc)  ? AG Ratio 1.7 1.0 - 2.5 (calc)  ? Total Bilirubin 0.4 0.2 - 1.2 mg/dL  ? Alkaline phosphatase (APISO) 68 37 - 153 U/L  ? AST 25 10 - 35 U/L  ? ALT 19 6 - 29 U/L  ?CBC with Differential/Platelet  ?Result Value Ref Range  ? WBC 5.9  3.8 - 10.8 Thousand/uL  ? RBC 4.30 3.80 - 5.10 Million/uL  ? Hemoglobin 14.4 11.7 - 15.5 g/dL  ? HCT 41.9 35.0 - 45.0 %  ? MCV 97.4 80.0 - 100.0 fL  ? MCH 33.5 (H) 27.0 - 33.0 pg  ? MCHC 34.4 32.0 - 36.0 g/dL  ? RDW 12.5 11.0 - 15.0 %  ? Platelets 241 140 - 400 Thousand/uL  ? MPV 10.2 7.5 - 12.5 fL  ? Neutro Abs 2,926 1,500 - 7,800 cells/uL  ? Lymphs Abs 2,354 850 - 3,900 cells/uL  ? Absolute Monocytes 431 200 - 950 cells/uL  ? Eosinophils Absolute 148 15 - 500 cells/uL  ? Basophils Absolute 41 0 - 200 cells/uL  ? Neutrophils Relative % 49.6 %  ? Total Lymphocyte 39.9 %  ? Monocytes Relative 7.3 %  ? Eosinophils Relative 2.5 %  ? Basophils Relative 0.7 %  ?Hemoglobin A1c  ?Result Value Ref Range  ? Hgb A1c MFr Bld 5.1 <5.7 % of total Hgb  ? Mean Plasma Glucose 100 mg/dL  ? eAG (mmol/L) 5.5 mmol/L  ? ?   ?Assessment & Plan:  ? ?Problem List Items Addressed This Visit   ?None ?  ?Diarrhea secondary to prednisone side effect ?X 4 days now ?Tolerating PO liquids, has not advanced to solids ?Trial on Imodium limited results ?Admits feeling tired fatigued but today somewhat better ?Not having nausea vomiting or other symptoms concerning ?No acute abdomen ?Reassurance given ?Review oral rehydration options, increase electrolytes ?Samples of Ensure given today ?Return criteria given, consider labs BMET in few days and stool studies including C Diff if develops watery stool. ? ? ?No orders of the defined types were placed in this encounter. ? ? ? ? ?Follow up plan: ?No follow-ups on file. ? ?ANobie Putnam DO ?[facility] ?08/22/2021, 4:50 PM ?

## 2021-08-23 NOTE — Patient Instructions (Signed)
° °  Please schedule a Follow-up Appointment to: No follow-ups on file. ° °If you have any other questions or concerns, please feel free to call the office or send a message through MyChart. You may also schedule an earlier appointment if necessary. ° °Additionally, you may be receiving a survey about your experience at our office within a few days to 1 week by e-mail or mail. We value your feedback. ° °Emmalou Hunger, DO °[facility] °

## 2021-11-30 ENCOUNTER — Other Ambulatory Visit: Payer: Self-pay | Admitting: Family Medicine

## 2021-11-30 ENCOUNTER — Encounter: Payer: Self-pay | Admitting: Family Medicine

## 2021-11-30 ENCOUNTER — Ambulatory Visit (INDEPENDENT_AMBULATORY_CARE_PROVIDER_SITE_OTHER): Payer: PPO | Admitting: Family Medicine

## 2021-11-30 VITALS — BP 132/64 | HR 61 | Ht 61.0 in | Wt 122.8 lb

## 2021-11-30 DIAGNOSIS — R7309 Other abnormal glucose: Secondary | ICD-10-CM

## 2021-11-30 DIAGNOSIS — H6981 Other specified disorders of Eustachian tube, right ear: Secondary | ICD-10-CM

## 2021-11-30 DIAGNOSIS — G8929 Other chronic pain: Secondary | ICD-10-CM

## 2021-11-30 DIAGNOSIS — Z0000 Encounter for general adult medical examination without abnormal findings: Secondary | ICD-10-CM

## 2021-11-30 DIAGNOSIS — F5101 Primary insomnia: Secondary | ICD-10-CM

## 2021-11-30 DIAGNOSIS — H9201 Otalgia, right ear: Secondary | ICD-10-CM

## 2021-11-30 DIAGNOSIS — E7800 Pure hypercholesterolemia, unspecified: Secondary | ICD-10-CM

## 2021-11-30 NOTE — Progress Notes (Signed)
Subjective:    Patient ID: Kim Ayala, female    DOB: Apr 24, 1946, 76 y.o.   MRN: [id number]  Georgeanne Frankland is a 76 y.o. female presenting on 11/30/2021 for Ear Pain and ear pressure   HPI  Sinusitis Headache since 08/2021 Right Ear Pain Dental symptoms  Admits some reduced hearing Right ear.  Dental Implant after root canal for 2 years. She says day after drilling for the implant it may have impacted her symptoms. Seems to be impacting her. In 2 weeks has upcoming apt with new Dentist  Has not seen ENT. No sinus imaging.  Reports felt pressure R ear  Off Flonase Admits allergies. Denies fever       11/30/2021   10:52 AM 12/06/2020    2:09 PM 07/19/2020    3:30 PM  Depression screen PHQ 2/9  Decreased Interest 0 0 0  Down, Depressed, Hopeless 1 1 0  PHQ - 2 Score 1 1 0  Altered sleeping 1 1   Tired, decreased energy 1 1   Change in appetite 0 0   Feeling bad or failure about yourself  0 0   Trouble concentrating 0 0   Moving slowly or fidgety/restless 0 0   Suicidal thoughts 0 0   PHQ-9 Score 3 3   Difficult doing work/chores Not difficult at all Not difficult at all     Social History   Tobacco Use   Smoking status: Never   Smokeless tobacco: Never  Substance Use Topics   Alcohol use: No    Alcohol/week: 0.0 standard drinks of alcohol   Drug use: No    Review of Systems Per HPI unless specifically indicated above     Objective:    BP 132/64 (BP Location: Left Arm, Cuff Size: Normal)   Pulse 61   Ht '5\' 1"'  (1.549 m)   Wt 122 lb 12.8 oz (55.7 kg)   SpO2 100%   BMI 23.20 kg/m   Wt Readings from Last 3 Encounters:  11/30/21 122 lb 12.8 oz (55.7 kg)  08/22/21 121 lb (54.9 kg)  08/17/21 121 lb (54.9 kg)    Physical Exam Vitals and nursing note reviewed.  Constitutional:      General: She is not in acute distress.    Appearance: Normal appearance. She is well-developed. She is not diaphoretic.     Comments: Well-appearing, comfortable, cooperative   HENT:     Head: Normocephalic and atraumatic.     Right Ear: Ear canal and external ear normal. There is no impacted cerumen.     Left Ear: Tympanic membrane, ear canal and external ear normal. There is no impacted cerumen.     Ears:     Comments: R TM some mild effusion  Non tender on palpation Eyes:     General:        Right eye: No discharge.        Left eye: No discharge.     Conjunctiva/sclera: Conjunctivae normal.  Cardiovascular:     Rate and Rhythm: Normal rate.  Pulmonary:     Effort: Pulmonary effort is normal.  Skin:    General: Skin is warm and dry.     Findings: No erythema or rash.  Neurological:     Mental Status: She is alert and oriented to person, place, and time.  Psychiatric:        Mood and Affect: Mood normal.        Behavior: Behavior normal.  Thought Content: Thought content normal.     Comments: Well groomed, good eye contact, normal speech and thoughts      Results for orders placed or performed in visit on 11/28/20  TSH  Result Value Ref Range   TSH 2.87 0.40 - 4.50 mIU/L  Lipid panel  Result Value Ref Range   Cholesterol 203 (H) <200 mg/dL   HDL 77 > OR = 50 mg/dL   Triglycerides 85 <150 mg/dL   LDL Cholesterol (Calc) 108 (H) mg/dL (calc)   Total CHOL/HDL Ratio 2.6 <5.0 (calc)   Non-HDL Cholesterol (Calc) 126 <130 mg/dL (calc)  COMPLETE METABOLIC PANEL WITH GFR  Result Value Ref Range   Glucose, Bld 83 65 - 99 mg/dL   BUN 16 7 - 25 mg/dL   Creat 0.78 0.60 - 1.00 mg/dL   eGFR 79 > OR = 60 mL/min/1.33m   BUN/Creatinine Ratio NOT APPLICABLE 6 - 22 (calc)   Sodium 140 135 - 146 mmol/L   Potassium 4.5 3.5 - 5.3 mmol/L   Chloride 105 98 - 110 mmol/L   CO2 28 20 - 32 mmol/L   Calcium 9.1 8.6 - 10.4 mg/dL   Total Protein 6.5 6.1 - 8.1 g/dL   Albumin 4.1 3.6 - 5.1 g/dL   Globulin 2.4 1.9 - 3.7 g/dL (calc)   AG Ratio 1.7 1.0 - 2.5 (calc)   Total Bilirubin 0.4 0.2 - 1.2 mg/dL   Alkaline phosphatase (APISO) 68 37 - 153 U/L   AST 25  10 - 35 U/L   ALT 19 6 - 29 U/L  CBC with Differential/Platelet  Result Value Ref Range   WBC 5.9 3.8 - 10.8 Thousand/uL   RBC 4.30 3.80 - 5.10 Million/uL   Hemoglobin 14.4 11.7 - 15.5 g/dL   HCT 41.9 35.0 - 45.0 %   MCV 97.4 80.0 - 100.0 fL   MCH 33.5 (H) 27.0 - 33.0 pg   MCHC 34.4 32.0 - 36.0 g/dL   RDW 12.5 11.0 - 15.0 %   Platelets 241 140 - 400 Thousand/uL   MPV 10.2 7.5 - 12.5 fL   Neutro Abs 2,926 1,500 - 7,800 cells/uL   Lymphs Abs 2,354 850 - 3,900 cells/uL   Absolute Monocytes 431 200 - 950 cells/uL   Eosinophils Absolute 148 15 - 500 cells/uL   Basophils Absolute 41 0 - 200 cells/uL   Neutrophils Relative % 49.6 %   Total Lymphocyte 39.9 %   Monocytes Relative 7.3 %   Eosinophils Relative 2.5 %   Basophils Relative 0.7 %  Hemoglobin A1c  Result Value Ref Range   Hgb A1c MFr Bld 5.1 <5.7 % of total Hgb   Mean Plasma Glucose 100 mg/dL   eAG (mmol/L) 5.5 mmol/L      Assessment & Plan:   Problem List Items Addressed This Visit   None Visit Diagnoses     Eustachian tube dysfunction, right    -  Primary   Chronic right ear pain           Subacute on chronic vs Recurrent R > L eustachian tube dysfunction with secondary R ear effusion, without loss of hearing or evidence of AOM or sinusitis. 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 Suspect chronic sinusitis No Head imaging  Ear externally normal. No problems identified, no wax  Plan: 1. Restart Flonase 2 sprays each nare daily for up to 4-6 weeks or longer 2. Continue anti histamine - Offer Azelastine Follow-up if not improved or  worsening, return criteria  She may return to dentist seems pain may be referred from dental.  Future ENT   No orders of the defined types were placed in this encounter.     Follow up plan: Return if symptoms worsen or fail to improve, for Add fasting lab only apt to [facility] 8/21 10am. She has physical later that afternoon.  Future labs ordered for  01/01/22   [name], [facility] 11/30/2021, 11:07 AM

## 2021-11-30 NOTE — Patient Instructions (Addendum)
Thank you for coming to the office today.  No sign of wax or other obstruction in ear.  Try the spray below as a steroid nasal spray to reduce swelling in sinuses this can reduce pressure causing your pain and problem.  There is some mild clear fluid behind the ear.  fluticasone (FLONASE) 50 MCG/ACT nasal spray 2 spray, Daily         Summary: Place 2 sprays into both nostrils daily. Use for 4-6 weeks then stop and use seasonally or as needed., Starting Thu 08/17/2021, Normal  Dose, Route, Frequency: 2 spray, Each Nare, Daily Start: 08/17/2021 Ord/Sold: 08/17/2021 (O) Pharmacy: [facility]. Report Taking:  Long-term:  Med Dose History     Patient Sig: Place 2 sprays into both nostrils daily. Use for 4-6 weeks then stop and use seasonally or as needed.     Ordered on: 08/17/2021     Authorized by: Olin Hauser     Dispense: 16 g     Refills: 3 ordered     Admin Instructions: Use for 4-6 weeks then stop and use seasonally or as needed.      -------------------------  If the dentist is not able to help, then we can pursue ENT specialist.  [facility] ENT [facility] [address]  [place], [place] [address] Ph: [phone] Dr Carloyn Manner  [facility] ENT [facility] [address]  [place], [place] [address] Ph: [phone]   Please schedule a Follow-up Appointment to: Return if symptoms worsen or fail to improve, for Add fasting lab only apt to [facility] 8/21 10am. She has physical later that afternoon.  If you have any other questions or concerns, please feel free to call the office or send a message through [place]. You may also schedule an earlier appointment if necessary.  Additionally, you may be receiving a survey about your experience at our office within a few days to 1 week by e-mail or mail. We value your feedback.  [name], DO [place]

## 2022-01-01 ENCOUNTER — Encounter: Payer: PPO | Admitting: Family Medicine

## 2022-01-01 ENCOUNTER — Other Ambulatory Visit: Payer: PPO

## 2022-01-02 ENCOUNTER — Ambulatory Visit (INDEPENDENT_AMBULATORY_CARE_PROVIDER_SITE_OTHER): Payer: PPO | Admitting: Family Medicine

## 2022-01-02 ENCOUNTER — Encounter: Payer: Self-pay | Admitting: Family Medicine

## 2022-01-02 ENCOUNTER — Other Ambulatory Visit: Payer: Self-pay | Admitting: Family Medicine

## 2022-01-02 DIAGNOSIS — H6981 Other specified disorders of Eustachian tube, right ear: Secondary | ICD-10-CM

## 2022-01-02 DIAGNOSIS — Z0000 Encounter for general adult medical examination without abnormal findings: Secondary | ICD-10-CM

## 2022-01-02 DIAGNOSIS — G8929 Other chronic pain: Secondary | ICD-10-CM

## 2022-01-02 DIAGNOSIS — H9191 Unspecified hearing loss, right ear: Secondary | ICD-10-CM

## 2022-01-02 DIAGNOSIS — E7800 Pure hypercholesterolemia, unspecified: Secondary | ICD-10-CM

## 2022-01-02 DIAGNOSIS — R7309 Other abnormal glucose: Secondary | ICD-10-CM | POA: Diagnosis not present

## 2022-01-02 DIAGNOSIS — F5101 Primary insomnia: Secondary | ICD-10-CM | POA: Diagnosis not present

## 2022-01-02 DIAGNOSIS — H9311 Tinnitus, right ear: Secondary | ICD-10-CM

## 2022-01-02 NOTE — Patient Instructions (Addendum)
Thank you for coming to the office today.  Stay tuned for lab results  Notify pharmacy when ready for refill on medication  Please schedule a Follow-up Appointment to: Return in about 1 year (around 01/03/2023) for 1 year for Annual Physical AM Fasting lab.  If you have any other questions or concerns, please feel free to call the office or send a message through [place]. You may also schedule an earlier appointment if necessary.  Additionally, you may be receiving a survey about your experience at our office within a few days to 1 week by e-mail or mail. We value your feedback.  [name], DO [place]

## 2022-01-02 NOTE — Progress Notes (Signed)
Subjective:    Patient ID: Kim Ayala, female    DOB: [birth year], 76 y.o.   MRN: [id number]  Kim Ayala is a 76 y.o. female presenting on 01/02/2022 for Annual Exam   HPI  Here for Annual Physical and Lab orders today   Lifestyle / HYPERLIPIDEMIA - Reports no concerns. Stable lipids, LDL 108 last year 2022 - Not taking any cholesterol medication. Not interested. Lifestyle - Diet: improving - Using weight watchers program - Gym 3 x week   Adjustment Disorder depression / anxiety, Insomnia Prior history of anxiety / adjustment to COVID pandemic and insomnia Taking Diazepam (valium) as needed only, rarely took, takes with good results PRN for insomnia.      Health Maintenance: UTD on COVID19 Vaccine  DEXA - last done 2012. She plans to keep doing strengthening. She is not interested in repeating a DEXA scan.   Breast Cancer Screening - declines routine screening mammogram, last done 2012, declines despite counseling on benefits of cancer prevention.   Colon CA Screening - declined colonoscopy / Completed Cologuard 12/22/19     11/30/2021   10:52 AM 12/06/2020    2:09 PM 07/19/2020    3:30 PM  Depression screen PHQ 2/9  Decreased Interest 0 0 0  Down, Depressed, Hopeless 1 1 0  PHQ - 2 Score 1 1 0  Altered sleeping 1 1   Tired, decreased energy 1 1   Change in appetite 0 0   Feeling bad or failure about yourself  0 0   Trouble concentrating 0 0   Moving slowly or fidgety/restless 0 0   Suicidal thoughts 0 0   PHQ-9 Score 3 3   Difficult doing work/chores Not difficult at all Not difficult at all     Past Medical History:  Diagnosis Date   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   Past Surgical History:  Procedure Laterality Date   ABDOMINAL HYSTERECTOMY  1975   unsure of type   APPENDECTOMY  1975   CATARACT EXTRACTION  2009   CHOLECYSTECTOMY  1998   FRACTURE SURGERY  1996   bone   LAPAROSCOPY Bilateral 08/30/2014   salpingo-oophorectomy and  adhesiolysis   RADICAL HYSTERECTOMY WITH TRANSPOSITION OF OVARIES     Social History   Socioeconomic History   Marital status: Divorced    Spouse name: Not on file   Number of children: Not on file   Years of education: Not on file   Highest education level: Not on file  Occupational History   Not on file  Tobacco Use   Smoking status: Never   Smokeless tobacco: Never  Substance and Sexual Activity   Alcohol use: No    Alcohol/week: 0.0 standard drinks of alcohol   Drug use: No   Sexual activity: Not Currently  Other Topics Concern   Not on file  Social History Narrative   Not on file   Social Determinants of Health   Financial Resource Strain: Not on file  Food Insecurity: Not on file  Transportation Needs: Not on file  Physical Activity: Not on file  Stress: Not on file  Social Connections: Not on file  Intimate Partner Violence: Not on file   Family History  Problem Relation Age of Onset   Heart disease Mother    Current Outpatient Medications on File Prior to Visit  Medication Sig   acetaminophen (TYLENOL) 500 MG tablet Take 2 tablets (1,000 mg total) by mouth every 8 (eight) hours as needed.   Calcium 150 MG  TABS Take 150 mg by mouth daily.   diazepam (VALIUM) 5 MG tablet TAKE 1 TABLET BY MOUTH AT BEDTIME AS NEEDED FOR ANXIETY   fluticasone (FLONASE) 50 MCG/ACT nasal spray Place 2 sprays into both nostrils daily. Use for 4-6 weeks then stop and use seasonally or as needed.   Multiple Vitamins-Minerals (ONE-A-DAY WOMENS 50+ PO) Take by mouth.   No current facility-administered medications on file prior to visit.    Review of Systems  Constitutional:  Negative for activity change, appetite change, chills, diaphoresis, fatigue and fever.  HENT:  Negative for congestion and hearing loss.   Eyes:  Negative for visual disturbance.  Respiratory:  Negative for cough, chest tightness, shortness of breath and wheezing.   Cardiovascular:  Negative for chest pain,  palpitations and leg swelling.  Gastrointestinal:  Negative for abdominal pain, constipation, diarrhea, nausea and vomiting.  Genitourinary:  Negative for dysuria, frequency and hematuria.  Musculoskeletal:  Negative for arthralgias and neck pain.  Skin:  Negative for rash.  Neurological:  Negative for dizziness, weakness, light-headedness, numbness and headaches.  Hematological:  Negative for adenopathy.  Psychiatric/Behavioral:  Negative for behavioral problems, dysphoric mood and sleep disturbance.    Per HPI unless specifically indicated above      Objective:    BP (!) 110/54 (BP Location: Left Arm, Cuff Size: Normal)   Pulse 61   Ht _0  (1.549 m)   Wt 122 lb 9.6 oz (55.6 kg)   SpO2 100%   BMI 23.17 kg/m   Wt Readings from Last 3 Encounters:  01/02/22 122 lb 9.6 oz (55.6 kg)  11/30/21 122 lb 12.8 oz (55.7 kg)  08/22/21 121 lb (54.9 kg)    Physical Exam Vitals and nursing note reviewed.  Constitutional:      General: She is not in acute distress.    Appearance: She is well-developed. She is not diaphoretic.     Comments: Well-appearing, comfortable, cooperative  HENT:     Head: Normocephalic and atraumatic.  Eyes:     General:        Right eye: No discharge.        Left eye: No discharge.     Conjunctiva/sclera: Conjunctivae normal.     Pupils: Pupils are equal, round, and reactive to light.  Neck:     Thyroid: No thyromegaly.  Cardiovascular:     Rate and Rhythm: Normal rate and regular rhythm.     Pulses: Normal pulses.     Heart sounds: Normal heart sounds. No murmur heard. Pulmonary:     Effort: Pulmonary effort is normal. No respiratory distress.     Breath sounds: Normal breath sounds. No wheezing or rales.  Abdominal:     General: Bowel sounds are normal. There is no distension.     Palpations: Abdomen is soft. There is no mass.     Tenderness: There is no abdominal tenderness.  Musculoskeletal:        General: No tenderness. Normal range of motion.      Cervical back: Normal range of motion and neck supple.     Comments: Upper / Lower Extremities: - Normal muscle tone, strength bilateral upper extremities 5/5, lower extremities 5/5  Lymphadenopathy:     Cervical: No cervical adenopathy.  Skin:    General: Skin is warm and dry.     Findings: No erythema or rash.  Neurological:     Mental Status: She is alert and oriented to person, place, and time.     Comments:  Distal sensation intact to light touch all extremities  Psychiatric:        Mood and Affect: Mood normal.        Behavior: Behavior normal.        Thought Content: Thought content normal.     Comments: Well groomed, good eye contact, normal speech and thoughts    Results for orders placed or performed in visit on 11/28/20  TSH  Result Value Ref Range   TSH 2.87 0.40 - 4.50 mIU/L  Lipid panel  Result Value Ref Range   Cholesterol 203 (H) <200 mg/dL   HDL 77 > OR = 50 mg/dL   Triglycerides 85 <150 mg/dL   LDL Cholesterol (Calc) 108 (H) mg/dL (calc)   Total CHOL/HDL Ratio 2.6 <5.0 (calc)   Non-HDL Cholesterol (Calc) 126 <130 mg/dL (calc)  COMPLETE METABOLIC PANEL WITH GFR  Result Value Ref Range   Glucose, Bld 83 65 - 99 mg/dL   BUN 16 7 - 25 mg/dL   Creat 0.78 0.60 - 1.00 mg/dL   eGFR 79 > OR = 60 mL/min/1.4m   BUN/Creatinine Ratio NOT APPLICABLE 6 - 22 (calc)   Sodium 140 135 - 146 mmol/L   Potassium 4.5 3.5 - 5.3 mmol/L   Chloride 105 98 - 110 mmol/L   CO2 28 20 - 32 mmol/L   Calcium 9.1 8.6 - 10.4 mg/dL   Total Protein 6.5 6.1 - 8.1 g/dL   Albumin 4.1 3.6 - 5.1 g/dL   Globulin 2.4 1.9 - 3.7 g/dL (calc)   AG Ratio 1.7 1.0 - 2.5 (calc)   Total Bilirubin 0.4 0.2 - 1.2 mg/dL   Alkaline phosphatase (APISO) 68 37 - 153 U/L   AST 25 10 - 35 U/L   ALT 19 6 - 29 U/L  CBC with Differential/Platelet  Result Value Ref Range   WBC 5.9 3.8 - 10.8 Thousand/uL   RBC 4.30 3.80 - 5.10 Million/uL   Hemoglobin 14.4 11.7 - 15.5 g/dL   HCT 41.9 35.0 - 45.0 %   MCV  97.4 80.0 - 100.0 fL   MCH 33.5 (H) 27.0 - 33.0 pg   MCHC 34.4 32.0 - 36.0 g/dL   RDW 12.5 11.0 - 15.0 %   Platelets 241 140 - 400 Thousand/uL   MPV 10.2 7.5 - 12.5 fL   Neutro Abs 2,926 1,500 - 7,800 cells/uL   Lymphs Abs 2,354 850 - 3,900 cells/uL   Absolute Monocytes 431 200 - 950 cells/uL   Eosinophils Absolute 148 15 - 500 cells/uL   Basophils Absolute 41 0 - 200 cells/uL   Neutrophils Relative % 49.6 %   Total Lymphocyte 39.9 %   Monocytes Relative 7.3 %   Eosinophils Relative 2.5 %   Basophils Relative 0.7 %  Hemoglobin A1c  Result Value Ref Range   Hgb A1c MFr Bld 5.1 <5.7 % of total Hgb   Mean Plasma Glucose 100 mg/dL   eAG (mmol/L) 5.5 mmol/L      Assessment & Plan:   Problem List Items Addressed This Visit     Insomnia   Pure hypercholesterolemia   Other Visit Diagnoses     Annual physical exam       Abnormal glucose           Updated Health Maintenance information Fasting labs drawn today, pending results Encouraged improvement to lifestyle with diet and exercise Goal of weight loss  Insomnia Chronic stable Using Diazepam PRN, has 1 refill remaining, will refill when needed  Additional update  R Ear Persistent Eustachian tube dysfunction, now seems improved effusion. She still has chronic hearing and tinnitus issue. She will follow up if needs further assistance. Or ENT referral    No orders of the defined types were placed in this encounter.    Follow up plan: Return in about 1 year (around 01/03/2023) for 1 year for Annual Physical AM Fasting lab.  [name], [facility] 01/02/2022, 11:27 AM

## 2022-01-03 LAB — COMPLETE METABOLIC PANEL WITH GFR
AG Ratio: 1.9 (calc) (ref 1.0–2.5)
ALT: 16 U/L (ref 6–29)
AST: 25 U/L (ref 10–35)
Albumin: 4.3 g/dL (ref 3.6–5.1)
Alkaline phosphatase (APISO): 78 U/L (ref 37–153)
BUN: 18 mg/dL (ref 7–25)
CO2: 26 mmol/L (ref 20–32)
Calcium: 9.7 mg/dL (ref 8.6–10.4)
Chloride: 107 mmol/L (ref 98–110)
Creat: 0.87 mg/dL (ref 0.60–1.00)
Globulin: 2.3 g/dL (calc) (ref 1.9–3.7)
Glucose, Bld: 82 mg/dL (ref 65–99)
Potassium: 4 mmol/L (ref 3.5–5.3)
Sodium: 142 mmol/L (ref 135–146)
Total Bilirubin: 0.4 mg/dL (ref 0.2–1.2)
Total Protein: 6.6 g/dL (ref 6.1–8.1)
eGFR: 69 mL/min/{1.73_m2} (ref >=60)

## 2022-01-03 LAB — CBC WITH DIFFERENTIAL/PLATELET
Absolute Monocytes: 456 cells/uL (ref 200–950)
Basophils Absolute: 61 cells/uL (ref 0–200)
Basophils Relative: 0.9 %
Eosinophils Absolute: 143 cells/uL (ref 15–500)
Eosinophils Relative: 2.1 %
HCT: 43.5 % (ref 35.0–45.0)
Hemoglobin: 14.8 g/dL (ref 11.7–15.5)
Lymphs Abs: 2570 cells/uL (ref 850–3900)
MCH: 32.6 pg (ref 27.0–33.0)
MCHC: 34 g/dL (ref 32.0–36.0)
MCV: 95.8 fL (ref 80.0–100.0)
MPV: 10.7 fL (ref 7.5–12.5)
Monocytes Relative: 6.7 %
Neutro Abs: 3570 cells/uL (ref 1500–7800)
Neutrophils Relative %: 52.5 %
Platelets: 235 10*3/uL (ref 140–400)
RBC: 4.54 10*6/uL (ref 3.80–5.10)
RDW: 12.2 % (ref 11.0–15.0)
Total Lymphocyte: 37.8 %
WBC: 6.8 10*3/uL (ref 3.8–10.8)

## 2022-01-03 LAB — LIPID PANEL
Cholesterol: 213 mg/dL — ABNORMAL HIGH (ref <200)
HDL: 80 mg/dL (ref >=50)
LDL Cholesterol (Calc): 108 mg/dL (calc) — ABNORMAL HIGH
Non-HDL Cholesterol (Calc): 133 mg/dL (calc) — ABNORMAL HIGH (ref <130)
Total CHOL/HDL Ratio: 2.7 (calc) (ref <5.0)
Triglycerides: 140 mg/dL (ref <150)

## 2022-01-03 LAB — HEMOGLOBIN A1C
Hgb A1c MFr Bld: 5.1 % of total Hgb (ref <5.7)
Mean Plasma Glucose: 100 mg/dL
eAG (mmol/L): 5.5 mmol/L

## 2022-01-03 LAB — TSH: TSH: 2.9 mIU/L (ref 0.40–4.50)

## 2022-01-10 ENCOUNTER — Other Ambulatory Visit: Payer: Self-pay | Admitting: Family Medicine

## 2022-01-10 DIAGNOSIS — F4323 Adjustment disorder with mixed anxiety and depressed mood: Secondary | ICD-10-CM

## 2022-01-10 DIAGNOSIS — F5101 Primary insomnia: Secondary | ICD-10-CM

## 2022-01-10 NOTE — Telephone Encounter (Signed)
Requested medications are due for refill today.  yes  Requested medications are on the active medications list.  yes  Last refill. 08/11/2021 #30 2 refills  Future visit scheduled.   no  Notes to clinic.  Refill not delegated.    Requested Prescriptions  Pending Prescriptions Disp Refills   diazepam (VALIUM) 5 MG tablet [Pharmacy Med Name: DIAZEPAM 5 MG TAB] 30 tablet     Sig: TAKE 1 TABLET BY MOUTH AT BEDTIME AS NEEDED FOR ANXIETY     Not Delegated - Psychiatry: Anxiolytics/Hypnotics 2 Failed - 01/10/2022  1:27 PM      Failed - This refill cannot be delegated      Failed - Urine Drug Screen completed in last 360 days      Passed - Patient is not pregnant      Passed - Valid encounter within last 6 months    Recent Outpatient Visits           1 week ago Annual physical exam   [place], DO   1 month ago Eustachian tube dysfunction, right   [place], DO   4 months ago Diarrhea, unspecified type   [place], DO   4 months ago Sinus headache   [place], DO   8 months ago Viral URI with cough   [facility], Coralie Ke[place], [place]

## 2022-01-24 DIAGNOSIS — H26493 Other secondary cataract, bilateral: Secondary | ICD-10-CM | POA: Diagnosis not present

## 2022-01-24 DIAGNOSIS — H90A31 Mixed conductive and sensorineural hearing loss, unilateral, right ear with restricted hearing on the contralateral side: Secondary | ICD-10-CM | POA: Diagnosis not present

## 2022-01-24 DIAGNOSIS — H9201 Otalgia, right ear: Secondary | ICD-10-CM | POA: Diagnosis not present

## 2022-01-24 DIAGNOSIS — H6983 Other specified disorders of Eustachian tube, bilateral: Secondary | ICD-10-CM | POA: Diagnosis not present

## 2022-01-24 DIAGNOSIS — H9313 Tinnitus, bilateral: Secondary | ICD-10-CM | POA: Diagnosis not present

## 2022-01-24 DIAGNOSIS — H903 Sensorineural hearing loss, bilateral: Secondary | ICD-10-CM | POA: Diagnosis not present

## 2022-01-24 DIAGNOSIS — M26621 Arthralgia of right temporomandibular joint: Secondary | ICD-10-CM | POA: Diagnosis not present

## 2022-02-28 ENCOUNTER — Ambulatory Visit (INDEPENDENT_AMBULATORY_CARE_PROVIDER_SITE_OTHER): Payer: PPO | Admitting: Family Medicine

## 2022-02-28 ENCOUNTER — Encounter: Payer: Self-pay | Admitting: Family Medicine

## 2022-02-28 VITALS — Ht 61.0 in | Wt 122.0 lb

## 2022-02-28 DIAGNOSIS — H9311 Tinnitus, right ear: Secondary | ICD-10-CM | POA: Diagnosis not present

## 2022-02-28 NOTE — Progress Notes (Deleted)
Subjective:    Patient ID: Kim Ayala, female    DOB: August 13, 1945, 76 y.o.   MRN: [id number]  Kim Ayala is a 76 y.o. female presenting on 02/28/2022 for Tinnitus and Discuss referral to ENT   HPI  Hearing has always been good. No loud trauma She admits ringing is persistent She has used sound machine to help with sleep.  TMJ, nope  Health Maintenance: ***     11/30/2021   10:52 AM 12/06/2020    2:09 PM 07/19/2020    3:30 PM  Depression screen PHQ 2/9  Decreased Interest 0 0 0  Down, Depressed, Hopeless 1 1 0  PHQ - 2 Score 1 1 0  Altered sleeping 1 1   Tired, decreased energy 1 1   Change in appetite 0 0   Feeling bad or failure about yourself  0 0   Trouble concentrating 0 0   Moving slowly or fidgety/restless 0 0   Suicidal thoughts 0 0   PHQ-9 Score 3 3   Difficult doing work/chores Not difficult at all Not difficult at all     Social History   Tobacco Use   Smoking status: Never   Smokeless tobacco: Never  Substance Use Topics   Alcohol use: No    Alcohol/week: 0.0 standard drinks of alcohol   Drug use: No    Review of Systems Per HPI unless specifically indicated above     Objective:    Ht '5\' 1"'  (1.549 m)   Wt 122 lb (55.3 kg)   BMI 23.05 kg/m   Wt Readings from Last 3 Encounters:  02/28/22 122 lb (55.3 kg)  01/02/22 122 lb 9.6 oz (55.6 kg)  11/30/21 122 lb 12.8 oz (55.7 kg)    Physical Exam Results for orders placed or performed in visit on 01/02/22  TSH  Result Value Ref Range   TSH 2.90 0.40 - 4.50 mIU/L  Hemoglobin A1c  Result Value Ref Range   Hgb A1c MFr Bld 5.1 <5.7 % of total Hgb   Mean Plasma Glucose 100 mg/dL   eAG (mmol/L) 5.5 mmol/L  Lipid panel  Result Value Ref Range   Cholesterol 213 (H) <200 mg/dL   HDL 80 > OR = 50 mg/dL   Triglycerides 140 <150 mg/dL   LDL Cholesterol (Calc) 108 (H) mg/dL (calc)   Total CHOL/HDL Ratio 2.7 <5.0 (calc)   Non-HDL Cholesterol (Calc) 133 (H) <130 mg/dL (calc)  CBC with  Differential/Platelet  Result Value Ref Range   WBC 6.8 3.8 - 10.8 Thousand/uL   RBC 4.54 3.80 - 5.10 Million/uL   Hemoglobin 14.8 11.7 - 15.5 g/dL   HCT 43.5 35.0 - 45.0 %   MCV 95.8 80.0 - 100.0 fL   MCH 32.6 27.0 - 33.0 pg   MCHC 34.0 32.0 - 36.0 g/dL   RDW 12.2 11.0 - 15.0 %   Platelets 235 140 - 400 Thousand/uL   MPV 10.7 7.5 - 12.5 fL   Neutro Abs 3,570 1,500 - 7,800 cells/uL   Lymphs Abs 2,570 850 - 3,900 cells/uL   Absolute Monocytes 456 200 - 950 cells/uL   Eosinophils Absolute 143 15 - 500 cells/uL   Basophils Absolute 61 0 - 200 cells/uL   Neutrophils Relative % 52.5 %   Total Lymphocyte 37.8 %   Monocytes Relative 6.7 %   Eosinophils Relative 2.1 %   Basophils Relative 0.9 %  COMPLETE METABOLIC PANEL WITH GFR  Result Value Ref Range   Glucose, Bld 82  65 - 99 mg/dL   BUN 18 7 - 25 mg/dL   Creat 0.87 0.60 - 1.00 mg/dL   eGFR 69 > OR = 60 mL/min/1.33m   BUN/Creatinine Ratio SEE NOTE: 6 - 22 (calc)   Sodium 142 135 - 146 mmol/L   Potassium 4.0 3.5 - 5.3 mmol/L   Chloride 107 98 - 110 mmol/L   CO2 26 20 - 32 mmol/L   Calcium 9.7 8.6 - 10.4 mg/dL   Total Protein 6.6 6.1 - 8.1 g/dL   Albumin 4.3 3.6 - 5.1 g/dL   Globulin 2.3 1.9 - 3.7 g/dL (calc)   AG Ratio 1.9 1.0 - 2.5 (calc)   Total Bilirubin 0.4 0.2 - 1.2 mg/dL   Alkaline phosphatase (APISO) 78 37 - 153 U/L   AST 25 10 - 35 U/L   ALT 16 6 - 29 U/L      Assessment & Plan:   Problem List Items Addressed This Visit   None   No orders of the defined types were placed in this encounter.     Follow up plan: No follow-ups on file.   ANobie Putnam DO [facility] 02/28/2022, 5:03 PM

## 2022-03-01 NOTE — Progress Notes (Signed)
Virtual Visit via Telephone The purpose of this virtual visit is to provide medical care while limiting exposure to the novel coronavirus (COVID19) for both patient and office staff.  Consent was obtained for phone visit:  Yes.   Answered questions that patient had about telehealth interaction:  Yes.   I discussed the limitations, risks, security and privacy concerns of performing an evaluation and management service by telephone. I also discussed with the patient that there may be a patient responsible charge related to this service. The patient expressed understanding and agreed to proceed.  Patient Location: Home Provider Location: [facility] (Office)  Participants in virtual visit: - Patient: Kim Ayala - CMA: Orinda Kenner, [place] - Provider: Dr Parks Ranger  ---------------------------------------------------------------------- Chief Complaint  Patient presents with   Tinnitus   Discuss referral to ENT    S: Reviewed CMA documentation. I have called patient and gathered additional HPI as follows:   Tinnitus Hearing has always been good. No loud trauma She admits ringing is persistent She has used sound machine to help with sleep. She has seen [facility] Dr Richardson Landry, advised nothing they can do. Has not seen Audiology yet. 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   Social History   Tobacco Use   Smoking status: Never   Smokeless tobacco: Never  Substance Use Topics   Alcohol use: No    Alcohol/week: 0.0 standard drinks of alcohol   Drug use: No    Current Outpatient Medications:    acetaminophen (TYLENOL) 500 MG tablet, Take 2 tablets (1,000 mg total) by mouth every 8 (eight) hours as needed., Disp: 30 tablet, Rfl: 0   Calcium 150 MG TABS, Take 150 mg by mouth daily., Disp: , Rfl:    diazepam (VALIUM) 5 MG tablet,  TAKE 1 TABLET BY MOUTH AT BEDTIME AS NEEDED FOR ANXIETY, Disp: 30 tablet, Rfl: 2   fluticasone (FLONASE) 50 MCG/ACT nasal spray, Place 2 sprays into both nostrils daily. Use for 4-6 weeks then stop and use seasonally or as needed., Disp: 16 g, Rfl: 3   Multiple Vitamins-Minerals (ONE-A-DAY WOMENS 50+ PO), Take by mouth., Disp: , Rfl:      11/30/2021   10:52 AM 12/06/2020    2:09 PM 07/19/2020    3:30 PM  Depression screen PHQ 2/9  Decreased Interest 0 0 0  Down, Depressed, Hopeless 1 1 0  PHQ - 2 Score 1 1 0  Altered sleeping 1 1   Tired, decreased energy 1 1   Change in appetite 0 0   Feeling bad or failure about yourself  0 0   Trouble concentrating 0 0   Moving slowly or fidgety/restless 0 0   Suicidal thoughts 0 0   PHQ-9 Score 3 3   Difficult doing work/chores Not difficult at all Not difficult at all        11/30/2021   10:52 AM 12/06/2020    2:10 PM 11/04/2018    5:10 PM 09/18/2018    8:41 AM  GAD 7 : Generalized Anxiety Score  Nervous, Anxious, on Edge 0 0 1 3  Control/stop worrying 0 0 1 3  Worry too much - different things 0 0 1 3  Trouble relaxing '1 1 1 3  ' Restless 0 0 0 0  Easily annoyed or irritable 0 0 1 0  Afraid - awful might happen 1 1 1  3  Total GAD 7 Score '2 2 6 15  ' Anxiety Difficulty Not difficult at all Not difficult at all Not difficult at all Not difficult at all    -------------------------------------------------------------------------- O: No physical exam performed due to remote telephone encounter.  Lab results reviewed.  Recent Results (from the past 2160 hour(s))  TSH     Status: None   Collection Time: 01/02/22  9:50 AM  Result Value Ref Range   TSH 2.90 0.40 - 4.50 mIU/L  Hemoglobin A1c     Status: None   Collection Time: 01/02/22  9:50 AM  Result Value Ref Range   Hgb A1c MFr Bld 5.1 <5.7 % of total Hgb    Comment: For the purpose of screening for the presence of diabetes: . <5.7%       Consistent with the absence of  diabetes 5.7-6.4%    Consistent with increased risk for diabetes             (prediabetes) > or =6.5%  Consistent with diabetes . This assay result is consistent with a decreased risk of diabetes. . Currently, no consensus exists regarding use of hemoglobin A1c for diagnosis of diabetes in children. . According to American Diabetes Association (ADA) guidelines, hemoglobin A1c <7.0% represents optimal control in non-pregnant diabetic patients. Different metrics may apply to specific patient populations.  Standards of Medical Care in Diabetes(ADA). .    Mean Plasma Glucose 100 mg/dL   eAG (mmol/L) 5.5 mmol/L  Lipid panel     Status: Abnormal   Collection Time: 01/02/22  9:50 AM  Result Value Ref Range   Cholesterol 213 (H) <200 mg/dL   HDL 80 > OR = 50 mg/dL   Triglycerides 140 <150 mg/dL   LDL Cholesterol (Calc) 108 (H) mg/dL (calc)    Comment: Reference range: <100 . Desirable range <100 mg/dL for primary prevention;   <70 mg/dL for patients with CHD or diabetic patients  with > or = 2 CHD risk factors. Marland Kitchen LDL-C is now calculated using the Martin-Hopkins  calculation, which is a validated novel method providing  better accuracy than the Friedewald equation in the  estimation of LDL-C.  Cresenciano Genre et al. Annamaria Helling. 3825;053(97): 2061-2068  (http://education.[employer].com/faq/FAQ164)    Total CHOL/HDL Ratio 2.7 <5.0 (calc)   Non-HDL Cholesterol (Calc) 133 (H) <130 mg/dL (calc)    Comment: For patients with diabetes plus 1 major ASCVD risk  factor, treating to a non-HDL-C goal of <100 mg/dL  (LDL-C of <70 mg/dL) is considered a therapeutic  option.   CBC with Differential/Platelet     Status: None   Collection Time: 01/02/22  9:50 AM  Result Value Ref Range   WBC 6.8 3.8 - 10.8 Thousand/uL   RBC 4.54 3.80 - 5.10 Million/uL   Hemoglobin 14.8 11.7 - 15.5 g/dL   HCT 43.5 35.0 - 45.0 %   MCV 95.8 80.0 - 100.0 fL   MCH 32.6 27.0 - 33.0 pg   MCHC 34.0 32.0 - 36.0 g/dL    RDW 12.2 11.0 - 15.0 %   Platelets 235 140 - 400 Thousand/uL   MPV 10.7 7.5 - 12.5 fL   Neutro Abs 3,570 1,500 - 7,800 cells/uL   Lymphs Abs 2,570 850 - 3,900 cells/uL   Absolute Monocytes 456 200 - 950 cells/uL   Eosinophils Absolute 143 15 - 500 cells/uL   Basophils Absolute 61 0 - 200 cells/uL   Neutrophils Relative % 52.5 %   Total Lymphocyte 37.8 %   Monocytes  Relative 6.7 %   Eosinophils Relative 2.1 %   Basophils Relative 0.9 %  COMPLETE METABOLIC PANEL WITH GFR     Status: None   Collection Time: 01/02/22  9:50 AM  Result Value Ref Range   Glucose, Bld 82 65 - 99 mg/dL    Comment: .            Fasting reference interval .    BUN 18 7 - 25 mg/dL   Creat 0.87 0.60 - 1.00 mg/dL   eGFR 69 > OR = 60 mL/min/1.51m   BUN/Creatinine Ratio SEE NOTE: 6 - 22 (calc)    Comment:    Not Reported: BUN and Creatinine are within    reference range. .    Sodium 142 135 - 146 mmol/L   Potassium 4.0 3.5 - 5.3 mmol/L   Chloride 107 98 - 110 mmol/L   CO2 26 20 - 32 mmol/L   Calcium 9.7 8.6 - 10.4 mg/dL   Total Protein 6.6 6.1 - 8.1 g/dL   Albumin 4.3 3.6 - 5.1 g/dL   Globulin 2.3 1.9 - 3.7 g/dL (calc)   AG Ratio 1.9 1.0 - 2.5 (calc)   Total Bilirubin 0.4 0.2 - 1.2 mg/dL   Alkaline phosphatase (APISO) 78 37 - 153 U/L   AST 25 10 - 35 U/L   ALT 16 6 - 29 U/L    -------------------------------------------------------------------------- A&P:  Problem List Items Addressed This Visit   None Visit Diagnoses     Tinnitus of right ear    -  Primary      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 She can pursue hearing test and hearing aid evaluation if indicated. If interested in 2nd opinion we would consider other options but limited locally, [facility]  No orders of the  defined types were placed in this encounter.   Follow-up: - Return PRN  Patient verbalizes understanding with the above medical recommendations including the limitation of remote medical advice.  Specific follow-up and call-back criteria were given for patient to follow-up or seek medical care more urgently if needed.   - Time spent in direct consultation with patient on phone: 8 minutes  ANobie Putnam [facility] 02/28/2022, 4:55PM

## 2022-03-01 NOTE — Patient Instructions (Signed)
° °  Please schedule a Follow-up Appointment to: No follow-ups on file. ° °If you have any other questions or concerns, please feel free to call the office or send a message through MyChart. You may also schedule an earlier appointment if necessary. ° °Additionally, you may be receiving a survey about your experience at our office within a few days to 1 week by e-mail or mail. We value your feedback. ° °Shawnell Dykes, DO °[facility], [facility] °

## 2022-04-09 ENCOUNTER — Encounter: Payer: Self-pay | Admitting: Family Medicine

## 2022-04-09 ENCOUNTER — Ambulatory Visit: Payer: Self-pay | Admitting: *Deleted

## 2022-04-09 ENCOUNTER — Ambulatory Visit (INDEPENDENT_AMBULATORY_CARE_PROVIDER_SITE_OTHER): Payer: PPO | Admitting: Family Medicine

## 2022-04-09 VITALS — Ht 61.0 in | Wt 122.0 lb

## 2022-04-09 DIAGNOSIS — J0110 Acute frontal sinusitis, unspecified: Secondary | ICD-10-CM

## 2022-04-09 DIAGNOSIS — J029 Acute pharyngitis, unspecified: Secondary | ICD-10-CM

## 2022-04-09 DIAGNOSIS — R509 Fever, unspecified: Secondary | ICD-10-CM

## 2022-04-09 LAB — POCT INFLUENZA A/B
Influenza A, POC: NEGATIVE
Influenza B, POC: NEGATIVE

## 2022-04-09 LAB — POCT RAPID STREP A (OFFICE): Rapid Strep A Screen: NEGATIVE

## 2022-04-09 LAB — POC COVID19 BINAXNOW: SARS Coronavirus 2 Ag: NEGATIVE

## 2022-04-09 MED ORDER — PREDNISONE 20 MG PO TABS: ORAL_TABLET | ORAL | 0 refills | Status: DC

## 2022-04-09 MED ORDER — AMOXICILLIN-POT CLAVULANATE 875-125 MG PO TABS: 1.0000 | ORAL_TABLET | Freq: Two times a day (BID) | ORAL | 0 refills | Status: DC

## 2022-04-09 NOTE — Telephone Encounter (Signed)
Summary: Sore throat/fever/congestion   Patient states that she has been running 101.5 fever with an extremely sore throat, cough, congestion for 5+ days. No appointments today.       Chief Complaint: sore throat, cough and fever tested negative at home test 4-5 days ago  Symptoms: sore throat , cough productive silver mucus , fever 100. Taking nyquil Frequency: 5 days  Pertinent Negatives: Patient denies chest pain no difficulty breathing  Disposition: '[]'$ ED /'[]'$ Urgent Care (no appt availability in office) / '[x]'$ Appointment(In office/virtual)/ '[]'$  [facility]/ '[]'$ Home Care/ '[]'$ Refused Recommended Disposition /'[]'$ [facility]/ '[]'$  Follow-up with PCP Additional Notes:   Appt scheduled for today .       Reason for Disposition  Fever present > 3 days (72 hours)  Answer Assessment - Initial Assessment Questions 1. ONSET: "When did the cough begin?"      5 days  2. SEVERITY: "How bad is the cough today?"      Coughing up silver colored mucus, sore throat worsening 3. SPUTUM: "Describe the color of your sputum" (none, dry cough; clear, white, yellow, green)     silver 4. HEMOPTYSIS: "Are you coughing up any blood?" If so ask: "How much?" (flecks, streaks, tablespoons, etc.)     no 5. DIFFICULTY BREATHING: "Are you having difficulty breathing?" If Yes, ask: "How bad is it?" (e.g., mild, moderate, severe)    - MILD: No SOB at rest, mild SOB with walking, speaks normally in sentences, can lie down, no retractions, pulse < 100.    - MODERATE: SOB at rest, SOB with minimal exertion and prefers to sit, cannot lie down flat, speaks in phrases, mild retractions, audible wheezing, pulse 100-120.    - SEVERE: Very SOB at rest, speaks in single words, struggling to breathe, sitting hunched forward, retractions, pulse > 120      No  6. FEVER: "Do you have a fever?" If Yes, ask: "What is your temperature, how was it measured, and when did it start?"     Yes 100 today  7. CARDIAC  HISTORY: "Do you have any history of heart disease?" (e.g., heart attack, congestive heart failure)      See hx  8. LUNG HISTORY: "Do you have any history of lung disease?"  (e.g., pulmonary embolus, asthma, emphysema)     na 9. PE RISK FACTORS: "Do you have a history of blood clots?" (or: recent major surgery, recent prolonged travel, bedridden)     na 10. OTHER SYMPTOMS: "Do you have any other symptoms?" (e.g., runny nose, wheezing, chest pain)       Cough productive silver mucus. Sore throat worse sx. Fever covid test 4-5 days ago negative  11. PREGNANCY: "Is there any chance you are pregnant?" "When was your last menstrual period?"       na 12. TRAVEL: "Have you traveled out of the country in the last month?" (e.g., travel history, exposures)       na  Protocols used: Cough - Acute Productive-A-AH

## 2022-04-09 NOTE — Progress Notes (Signed)
Virtual Visit via Telephone The purpose of this virtual visit is to provide medical care while limiting exposure to the novel coronavirus (COVID19) for both patient and office staff.  Consent was obtained for phone visit:  Yes.   Answered questions that patient had about telehealth interaction:  Yes.   I discussed the limitations, risks, security and privacy concerns of performing an evaluation and management service by telephone. I also discussed with the patient that there may be a patient responsible charge related to this service. The patient expressed understanding and agreed to proceed.  Patient Location: Home Provider Location: [facility] (Office)  Participants in virtual visit: - Patient: Kim Ayala - CMA: Orinda Kenner, [place] - Provider: Dr Parks Ranger  ---------------------------------------------------------------------- Chief Complaint  Patient presents with   Cough   Sore Throat    S: Reviewed CMA documentation. I have called patient and gathered additional HPI as follows:  Sinusitis / Pharyngitis Reports that symptoms started 5-7 days ago with sore throat cough sinus congestion drainage sore throat swelling and pain worsening, not improved. Tried OTC meds. No relief here for evaluation. History of strep before asking for tests.. - Tried OTC DayQuil, occasional Tylenol  Admits fever 101.33F cough, shortness of breath, sore throat sinus pain or pressure, headache  Denies any known or suspected exposure to person with or possibly with COVID19.  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   Social History   Tobacco Use   Smoking status: Never   Smokeless tobacco: Never  Substance Use Topics   Alcohol use: No    Alcohol/week: 0.0 standard drinks of alcohol   Drug use: No    Current Outpatient Medications:    acetaminophen (TYLENOL) 500  MG tablet, Take 2 tablets (1,000 mg total) by mouth every 8 (eight) hours as needed., Disp: 30 tablet, Rfl: 0   amoxicillin-clavulanate (AUGMENTIN) 875-125 MG tablet, Take 1 tablet by mouth 2 (two) times daily., Disp: 20 tablet, Rfl: 0   Calcium 150 MG TABS, Take 150 mg by mouth daily., Disp: , Rfl:    diazepam (VALIUM) 5 MG tablet, TAKE 1 TABLET BY MOUTH AT BEDTIME AS NEEDED FOR ANXIETY, Disp: 30 tablet, Rfl: 2   fluticasone (FLONASE) 50 MCG/ACT nasal spray, Place 2 sprays into both nostrils daily. Use for 4-6 weeks then stop and use seasonally or as needed., Disp: 16 g, Rfl: 3   Multiple Vitamins-Minerals (ONE-A-DAY WOMENS 50+ PO), Take by mouth., Disp: , Rfl:    predniSONE (DELTASONE) 20 MG tablet, Take daily with food. Start with '40mg'$  (2 pills) x 3 days, then reduce to '20mg'$  (1 pills) x 3 days, Disp: 9 tablet, Rfl: 0     11/30/2021   10:52 AM 12/06/2020    2:09 PM 07/19/2020    3:30 PM  Depression screen PHQ 2/9  Decreased Interest 0 0 0  Down, Depressed, Hopeless 1 1 0  PHQ - 2 Score 1 1 0  Altered sleeping 1 1   Tired, decreased energy 1 1   Change in appetite 0 0   Feeling bad or failure about yourself  0 0   Trouble concentrating 0 0   Moving slowly or fidgety/restless 0 0   Suicidal thoughts 0 0   PHQ-9 Score 3 3   Difficult doing work/chores Not difficult at all Not difficult at all        11/30/2021   10:52 AM 12/06/2020  2:10 PM 11/04/2018    5:10 PM 09/18/2018    8:41 AM  GAD 7 : Generalized Anxiety Score  Nervous, Anxious, on Edge 0 0 1 3  Control/stop worrying 0 0 1 3  Worry too much - different things 0 0 1 3  Trouble relaxing '1 1 1 3  '$ Restless 0 0 0 0  Easily annoyed or irritable 0 0 1 0  Afraid - awful might happen '1 1 1 3  '$ Total GAD 7 Score '2 2 6 15  '$ Anxiety Difficulty Not difficult at all Not difficult at all Not difficult at all Not difficult at all    -------------------------------------------------------------------------- O: No physical exam performed  due to remote telephone encounter.  Lab results reviewed.  Recent Results (from the past 2160 hour(s))  POCT rapid strep A     Status: Normal   Collection Time: 04/09/22  3:53 PM  Result Value Ref Range   Rapid Strep A Screen Negative Negative  POCT Influenza A/B     Status: Normal   Collection Time: 04/09/22  3:53 PM  Result Value Ref Range   Influenza A, POC Negative Negative   Influenza B, POC Negative Negative  POC COVID-19     Status: Normal   Collection Time: 04/09/22  3:53 PM  Result Value Ref Range   SARS Coronavirus 2 Ag Negative Negative    -------------------------------------------------------------------------- A&P:  Problem List Items Addressed This Visit   None Visit Diagnoses     Acute non-recurrent frontal sinusitis    -  Primary   Relevant Medications   amoxicillin-clavulanate (AUGMENTIN) 875-125 MG tablet   predniSONE (DELTASONE) 20 MG tablet   Pharyngitis, unspecified etiology       Relevant Medications   amoxicillin-clavulanate (AUGMENTIN) 875-125 MG tablet   predniSONE (DELTASONE) 20 MG tablet   Other Relevant Orders   POC COVID-19 (Completed)   POCT Influenza A/B (Completed)   POCT rapid strep A (Completed)   Fever and chills       Relevant Orders   POC COVID-19 (Completed)   POCT Influenza A/B (Completed)   POCT rapid strep A (Completed)      Acute URI >7 days with progressive worse sore throat and swelling Febrile 101.78F recently Suspect viral syndrome  Virtual visit today offered, and drive up POC testing performed  Testing for COVID, Flu and Strep all negative today rapid POC testing.  Discussed viral etiology for pharyngitis and sinusitis that can be causing symptoms, now >7 days with worsening and not improvement, will agree to cover empirically with antibiotic course Augmentin and add Prednisone taper due to throat swelling / pain since she cannot tolerate oral NSAIDs  Reassurance, OTC regimen recommended as well for symptoms  relief  Return within 1 week if not improved or worsening.  Meds ordered this encounter  Medications   amoxicillin-clavulanate (AUGMENTIN) 875-125 MG tablet    Sig: Take 1 tablet by mouth 2 (two) times daily.    Dispense:  20 tablet    Refill:  0   predniSONE (DELTASONE) 20 MG tablet    Sig: Take daily with food. Start with '40mg'$  (2 pills) x 3 days, then reduce to '20mg'$  (1 pills) x 3 days    Dispense:  9 tablet    Refill:  0    Follow-up: - Return in 1 week if not improved  Patient verbalizes understanding with the above medical recommendations including the limitation of remote medical advice.  Specific follow-up and call-back criteria were given for patient  to follow-up or seek medical care more urgently if needed.   - Time spent in direct consultation with patient on phone: 8 minutes   [name], [facility] 04/09/2022, 3:44 PM

## 2022-05-17 ENCOUNTER — Ambulatory Visit (INDEPENDENT_AMBULATORY_CARE_PROVIDER_SITE_OTHER): Payer: PPO

## 2022-05-17 VITALS — Wt 122.0 lb

## 2022-05-17 DIAGNOSIS — Z23 Encounter for immunization: Secondary | ICD-10-CM

## 2022-05-22 ENCOUNTER — Telehealth: Payer: Self-pay

## 2022-05-22 NOTE — Telephone Encounter (Signed)
I contact the patient to attempt to schedule an AWV, the patient declined scheduling the appt.

## 2022-06-09 ENCOUNTER — Other Ambulatory Visit: Payer: Self-pay | Admitting: Family Medicine

## 2022-06-09 DIAGNOSIS — F5101 Primary insomnia: Secondary | ICD-10-CM

## 2022-06-09 DIAGNOSIS — F4323 Adjustment disorder with mixed anxiety and depressed mood: Secondary | ICD-10-CM

## 2022-06-11 NOTE — Telephone Encounter (Signed)
Requested medication (s) are due for refill today: yes  Requested medication (s) are on the active medication list: yes  Last refill:  01/11/22 #30/2  Future visit scheduled: no  Notes to clinic:  Unable to refill per protocol, cannot delegate.    Requested Prescriptions  Pending Prescriptions Disp Refills   diazepam (VALIUM) 5 MG tablet [Pharmacy Med Name: DIAZEPAM 5 MG TAB] 30 tablet     Sig: TAKE 1 TABLET BY MOUTH AT BEDTIME AS NEEDED FOR ANXIETY     Not Delegated - Psychiatry: Anxiolytics/Hypnotics 2 Failed - 06/09/2022  3:44 PM      Failed - This refill cannot be delegated      Failed - Urine Drug Screen completed in last 360 days      Passed - Patient is not pregnant      Passed - Valid encounter within last 6 months    Recent Outpatient Visits           2 months ago Acute non-recurrent frontal sinusitis   [place], DO   3 months ago Tinnitus of right ear   [place], DO   5 months ago Annual physical exam   [facility] Olin Hauser, DO   6 months ago Eustachian tube dysfunction, right   [place], DO   9 months ago Diarrhea, unspecified type   [place], Devonne Doughty, [place]

## 2022-10-02 ENCOUNTER — Encounter: Payer: Self-pay | Admitting: Dermatology

## 2022-10-02 ENCOUNTER — Ambulatory Visit: Payer: PPO | Admitting: Dermatology

## 2022-10-02 DIAGNOSIS — D1801 Hemangioma of skin and subcutaneous tissue: Secondary | ICD-10-CM

## 2022-10-02 DIAGNOSIS — L578 Other skin changes due to chronic exposure to nonionizing radiation: Secondary | ICD-10-CM | POA: Diagnosis not present

## 2022-10-02 DIAGNOSIS — X32XXXA Exposure to sunlight, initial encounter: Secondary | ICD-10-CM | POA: Diagnosis not present

## 2022-10-02 DIAGNOSIS — W908XXA Exposure to other nonionizing radiation, initial encounter: Secondary | ICD-10-CM | POA: Diagnosis not present

## 2022-10-02 DIAGNOSIS — L814 Other melanin hyperpigmentation: Secondary | ICD-10-CM

## 2022-10-02 DIAGNOSIS — L821 Other seborrheic keratosis: Secondary | ICD-10-CM

## 2022-10-02 NOTE — Patient Instructions (Addendum)
For cosmetic treatments:  Kim Sloop, MD Address: [address] [place], [place] [address] Phone: [phone]   Recommend daily broad spectrum sunscreen SPF 30+ to sun-exposed areas, reapply every 2 hours as needed. Call for new or changing lesions.  Staying in the shade or wearing long sleeves, sun glasses (UVA+UVB protection) and wide brim hats (4-inch brim around the entire circumference of the hat) are also recommended for sun protection.    Recommend taking Heliocare sun protection supplement daily in sunny weather for additional sun protection. For maximum protection on the sunniest days, you can take up to 2 capsules of regular Heliocare OR take 1 capsule of Heliocare Ultra. For prolonged exposure (such as a full day in the sun), you can repeat your dose of the supplement 4 hours after your first dose. Heliocare can be purchased at [employer], at some [facility] or at [email or web address].    Seborrheic Keratosis  What causes seborrheic keratoses? Seborrheic keratoses are harmless, common skin growths that first appear during adult life.  As time goes by, more growths appear.  Some people may develop a large number of them.  Seborrheic keratoses appear on both covered and uncovered body parts.  They are not caused by sunlight.  The tendency to develop seborrheic keratoses can be inherited.  They vary in color from skin-colored to gray, brown, or even black.  They can be either smooth or have a rough, warty surface.   Seborrheic keratoses are superficial and look as if they were stuck on the skin.  Under the microscope this type of keratosis looks like layers upon layers of skin.  That is why at times the top layer may seem to fall off, but the rest of the growth remains and re-grows.    Treatment Seborrheic keratoses do not need to be treated, but can easily be removed in the office.  Seborrheic keratoses often cause symptoms when they rub on clothing or jewelry.  Lesions  can be in the way of shaving.  If they become inflamed, they can cause itching, soreness, or burning.  Removal of a seborrheic keratosis can be accomplished by freezing, burning, or surgery. If any spot bleeds, scabs, or grows rapidly, please return to have it checked, as these can be an indication of a skin cancer.   Melanoma ABCDEs  Melanoma is the most dangerous type of skin cancer, and is the leading cause of death from skin disease.  You are more likely to develop melanoma if you: Have light-colored skin, light-colored eyes, or red or blond hair Spend a lot of time in the sun Tan regularly, either outdoors or in a tanning bed Have had blistering sunburns, especially during childhood Have a close family member who has had a melanoma Have atypical moles or large birthmarks  Early detection of melanoma is key since treatment is typically straightforward and cure rates are extremely high if we catch it early.   The first sign of melanoma is often a change in a mole or a new dark spot.  The ABCDE system is a way of remembering the signs of melanoma.  A for asymmetry:  The two halves do not match. B for border:  The edges of the growth are irregular. C for color:  A mixture of colors are present instead of an even brown color. D for diameter:  Melanomas are usually (but not always) greater than 6mm - the size of a pencil eraser. E for evolution:  The spot  keeps changing in size, shape, and color.  Please check your skin once per month between visits. You can use a small mirror in front and a large mirror behind you to keep an eye on the back side or your body.   If you see any new or changing lesions before your next follow-up, please call to schedule a visit.  Please continue daily skin protection including broad spectrum sunscreen SPF 30+ to sun-exposed areas, reapplying every 2 hours as needed when you're outdoors.   Staying in the shade or wearing long sleeves, sun glasses (UVA+UVB  protection) and wide brim hats (4-inch brim around the entire circumference of the hat) are also recommended for sun protection.     Due to recent changes in healthcare laws, you may see results of your pathology and/or laboratory studies on MyChart before the doctors have had a chance to review them. We understand that in some cases there may be results that are confusing or concerning to you. Please understand that not all results are received at the same time and often the doctors may need to interpret multiple results in order to provide you with the best plan of care or course of treatment. Therefore, we ask that you please give [place] 2 business days to thoroughly review all your results before contacting the office for clarification. Should we see a critical lab result, you will be contacted sooner.   If You Need Anything After Your Visit  If you have any questions or concerns for your doctor, please call our main line at [phone] and press option 4 to reach your doctor's medical assistant. If no one answers, please leave a voicemail as directed and we will return your call as soon as possible. Messages left after 4 pm will be answered the following business day.   You may also send [place] a message via MyChart. We typically respond to MyChart messages within 1-2 business days.  For prescription refills, please ask your pharmacy to contact our office. Our fax number is [phone].  If you have an urgent issue when the clinic is closed that cannot wait until the next business day, you can page your doctor at the number below.    Please note that while we do our best to be available for urgent issues outside of office hours, we are not available 24/7.   If you have an urgent issue and are unable to reach [place], you may choose to seek medical care at your doctor's office, retail clinic, urgent care center, or emergency room.  If you have a medical emergency, please immediately call 911 or go to  the emergency department.  Pager Numbers  - Dr. Gwen Pounds: [phone]  - Dr. Neale Burly: [phone]  - Dr. Roseanne Reno: [phone]  In the event of inclement weather, please call our main line at [phone] for an update on the status of any delays or closures.  Dermatology Medication Tips: Please keep the boxes that topical medications come in in order to help keep track of the instructions about where and how to use these. Pharmacies typically print the medication instructions only on the boxes and not directly on the medication tubes.   If your medication is too expensive, please contact our office at [phone] option 4 or send [place] a message through MyChart.   We are unable to tell what your co-pay for medications will be in advance as this is different depending on your insurance coverage. However, we may be able to find a  substitute medication at lower cost or fill out paperwork to get insurance to cover a needed medication.   If a prior authorization is required to get your medication covered by your insurance company, please allow [place] 1-2 business days to complete this process.  Drug prices often vary depending on where the prescription is filled and some pharmacies may offer cheaper prices.  The website www.goodrx.com contains coupons for medications through different pharmacies. The prices here do not account for what the cost may be with help from insurance (it may be cheaper with your insurance), but the website can give you the price if you did not use any insurance.  - You can print the associated coupon and take it with your prescription to the pharmacy.  - You may also stop by our office during regular business hours and pick up a GoodRx coupon card.  - If you need your prescription sent electronically to a different pharmacy, notify our office through [facility] or by phone at [phone] option 4.     Si Usted Necesita Algo Despus de Su Visita  Tambin puede  enviarnos un mensaje a travs de Clinical cytogeneticist. Por lo general respondemos a los mensajes de MyChart en el transcurso de 1 a 2 das hbiles.  Para renovar recetas, por favor pida a su farmacia que se ponga en contacto con nuestra oficina. Annie Sable de fax es [place] [phone].  Si tiene un asunto urgente cuando la clnica est cerrada y que no puede esperar hasta el siguiente da hbil, puede llamar/localizar a su doctor(a) al nmero que aparece a continuacin.   Por favor, tenga en cuenta que aunque hacemos todo lo posible para estar disponibles para asuntos urgentes fuera del horario de [place], no estamos disponibles las 24 horas del da, los 7 [address]  [address] de la [place].   Si tiene un problema urgente y no puede comunicarse con nosotros, puede optar por buscar atencin mdica  en el consultorio de su doctor(a), en una clnica privada, en un centro de atencin urgente o en una sala de emergencias.  Si tiene Engineer, drilling, por favor llame inmediatamente al 911 o vaya a la sala de emergencias.  Nmeros de bper  - Dr. Gwen Pounds: [phone]  - Dra. Moye: [phone]  - Dra. Roseanne Reno: [phone]  En caso de inclemencias del [place], por favor llame a Lacy Duverney principal al [phone] para una actualizacin sobre el [place] de cualquier retraso o cierre.  Consejos para la medicacin en dermatologa: Por favor, guarde las cajas en las que vienen los medicamentos de uso tpico para ayudarle a seguir las instrucciones sobre dnde y cmo usarlos. Las farmacias generalmente imprimen las instrucciones del medicamento slo en las cajas y no directamente en los tubos del [place].   Si su medicamento es muy caro, por favor, pngase en contacto con Rolm Gala llamando al [phone] y presione la opcin 4 o envenos un mensaje a travs de Clinical cytogeneticist.   No podemos decirle cul ser su copago por los medicamentos por adelantado ya que esto es diferente dependiendo de la cobertura de su seguro.  Sin embargo, es posible que podamos encontrar un medicamento sustituto a Audiological scientist un formulario para que el seguro cubra el medicamento que se considera necesario.   Si se requiere una autorizacin previa para que su compaa de seguros [place] su medicamento, por favor permtanos de 1 a 2 das hbiles para completar [address]  Los precios de los medicamentos varan con frecuencia dependiendo del Environmental consultant  de dnde se surte la receta y alguna farmacias pueden ofrecer precios ms baratos.  El sitio web www.goodrx.com tiene cupones para medicamentos de Health and safety inspector. Los precios aqu no tienen en cuenta lo que podra costar con la ayuda del seguro (puede ser ms barato con su seguro), pero el sitio web puede darle el precio si no utiliz Tourist information centre manager.  - Puede imprimir el cupn correspondiente y llevarlo con su receta a la farmacia.  - Tambin puede pasar por nuestra oficina durante el horario de atencin regular y Education officer, museum una tarjeta de cupones de GoodRx.  - Si necesita que su receta se enve electrnicamente a una farmacia diferente, informe a nuestra oficina a travs de MyChart de [place] o por telfono llamando al [phone] y presione la opcin 4.

## 2022-10-02 NOTE — Progress Notes (Signed)
   Follow-Up Visit   Subjective  Kim Ayala is a 77 y.o. female who presents for the following: Spots. Legs, breast folds.  The patient has spots, moles and lesions to be evaluated, some may be new or changing and the patient has concerns that these could be cancer.   The following portions of the chart were reviewed this encounter and updated as appropriate: medications, allergies, medical history  Review of Systems:  No other skin or systemic complaints except as noted in HPI or Assessment and Plan.  Objective  Well appearing patient in no apparent distress; mood and affect are within normal limits.  A focused examination was performed of the following areas: Legs, torso, face  Relevant physical exam findings are noted in the Assessment and Plan.    Assessment & Plan   Actinic skin damage  SEBORRHEIC KERATOSIS - Stuck-on, waxy, tan-brown papules and/or plaques  - Benign-appearing - Discussed benign etiology and prognosis. - Observe - Call for any changes  LENTIGINES Exam: scattered tan macules Due to sun exposure Treatment Plan: Benign-appearing, observe. Recommend daily broad spectrum sunscreen SPF 30+ to sun-exposed areas, reapply every 2 hours as needed.  Call for any changes  MELANOCYTIC NEVI Exam: Tan-brown and/or pink-flesh-colored symmetric macules and papules  Treatment Plan: Benign appearing on exam today. Recommend observation. Call clinic for new or changing moles. Recommend daily use of broad spectrum spf 30+ sunscreen to sun-exposed areas.   HEMANGIOMA Exam: red papule(s) Discussed benign nature. Recommend observation. Call for changes.  ACTINIC DAMAGE - chronic, secondary to cumulative UV radiation exposure/sun exposure over time - diffuse scaly erythematous macules with underlying dyspigmentation - Recommend daily broad spectrum sunscreen SPF 30+ to sun-exposed areas, reapply every 2 hours as needed.  - Recommend staying in the shade or wearing  long sleeves, sun glasses (UVA+UVB protection) and wide brim hats (4-inch brim around the entire circumference of the hat). - Call for new or changing lesions. - Recommend taking Heliocare sun protection supplement daily in sunny weather for additional sun protection. For maximum protection on the sunniest days, you can take up to 2 capsules of regular Heliocare OR take 1 capsule of Heliocare Ultra.    Return if symptoms worsen or fail to improve.  I, Lawson Radar, CMA, am acting as scribe for Darden Dates, MD.   Documentation: I have reviewed the above documentation for accuracy and completeness, and I agree with the above.  Darden Dates, MD

## 2022-10-16 ENCOUNTER — Telehealth: Payer: Self-pay

## 2022-10-16 DIAGNOSIS — F5101 Primary insomnia: Secondary | ICD-10-CM

## 2022-10-16 DIAGNOSIS — R7309 Other abnormal glucose: Secondary | ICD-10-CM

## 2022-10-16 DIAGNOSIS — Z0000 Encounter for general adult medical examination without abnormal findings: Secondary | ICD-10-CM

## 2022-10-16 DIAGNOSIS — E7800 Pure hypercholesterolemia, unspecified: Secondary | ICD-10-CM

## 2022-10-16 NOTE — Telephone Encounter (Signed)
Copied from CRM [id number]. Topic: General - Inquiry >> Oct 16, 2022  1:25 PM De Blanch wrote: Reason for CRM: Pt is requesting to schedule a lab visit for the upcoming CPE 08/23. No lab orders. Please advise.

## 2022-10-16 NOTE — Telephone Encounter (Signed)
Lab orders are in. Okay to go ahead and schedule her for fasting lab only week prior to her apt 01/04/23  Saralyn Pilar, DO [facility] 10/16/2022, 5:21 PM

## 2022-12-17 ENCOUNTER — Other Ambulatory Visit: Payer: Self-pay | Admitting: Family Medicine

## 2022-12-17 DIAGNOSIS — F4323 Adjustment disorder with mixed anxiety and depressed mood: Secondary | ICD-10-CM

## 2022-12-17 DIAGNOSIS — F5101 Primary insomnia: Secondary | ICD-10-CM

## 2022-12-18 NOTE — Telephone Encounter (Signed)
Requested medication (s) are due for refill today - yes  Requested medication (s) are on the active medication list -yes  Future visit scheduled -yes  Last refill: 06/11/22 #30 2RF  Notes to clinic: non delegated Rx  Requested Prescriptions  Pending Prescriptions Disp Refills   diazepam (VALIUM) 5 MG tablet [Pharmacy Med Name: DIAZEPAM 5 MG TAB] 30 tablet     Sig: TAKE 1 TABLET BY MOUTH AT BEDTIME AS NEEDED FOR ANXIETY     Not Delegated - Psychiatry: Anxiolytics/Hypnotics 2 Failed - 12/17/2022  4:25 PM      Failed - This refill cannot be delegated      Failed - Urine Drug Screen completed in last 360 days      Failed - Valid encounter within last 6 months    Recent Outpatient Visits           8 months ago Acute non-recurrent frontal sinusitis   [place] [facility] Smitty Cords, DO   9 months ago Tinnitus of right ear   [place] [facility] Smitty Cords, DO   11 months ago Annual physical exam   [place] [facility] Smitty Cords, DO   1 year ago Eustachian tube dysfunction, right   [facility] [place], Netta Neat, DO   1 year ago Diarrhea, unspecified type   [place] [facility] Althea Charon, Netta Neat, DO       Future Appointments             In 2 weeks Althea Charon, Netta Neat, DO [place] [facility]            Passed - Patient is not pregnant         Requested Prescriptions  Pending Prescriptions Disp Refills   diazepam (VALIUM) 5 MG tablet [Pharmacy Med Name: DIAZEPAM 5 MG TAB] 30 tablet     Sig: TAKE 1 TABLET BY MOUTH AT BEDTIME AS NEEDED FOR ANXIETY     Not Delegated - Psychiatry: Anxiolytics/Hypnotics 2 Failed - 12/17/2022  4:25 PM      Failed - This refill cannot be delegated      Failed - Urine Drug Screen completed in last 360 days      Failed - Valid encounter within last 6  months    Recent Outpatient Visits           8 months ago Acute non-recurrent frontal sinusitis   [place] [facility] Smitty Cords, DO   9 months ago Tinnitus of right ear   [place] [facility] Smitty Cords, DO   11 months ago Annual physical exam   [place] [facility] Smitty Cords, DO   1 year ago Eustachian tube dysfunction, right   [facility], Netta Neat, DO   1 year ago Diarrhea, unspecified type   [facility] Althea Charon, Netta Neat, DO       Future Appointments             In 2 weeks Althea Charon, Netta Neat, DO [place], [facility]            Passed - Patient is not pregnant

## 2022-12-28 ENCOUNTER — Other Ambulatory Visit: Payer: PPO

## 2023-01-01 ENCOUNTER — Other Ambulatory Visit: Payer: PPO

## 2023-01-01 DIAGNOSIS — F5101 Primary insomnia: Secondary | ICD-10-CM

## 2023-01-01 DIAGNOSIS — E7800 Pure hypercholesterolemia, unspecified: Secondary | ICD-10-CM | POA: Diagnosis not present

## 2023-01-01 DIAGNOSIS — Z0000 Encounter for general adult medical examination without abnormal findings: Secondary | ICD-10-CM | POA: Diagnosis not present

## 2023-01-01 DIAGNOSIS — R7309 Other abnormal glucose: Secondary | ICD-10-CM

## 2023-01-02 LAB — CBC WITH DIFFERENTIAL/PLATELET
Absolute Monocytes: 462 cells/uL (ref 200–950)
Basophils Absolute: 48 {cells}/uL (ref 0–200)
Basophils Relative: 0.7 %
Eosinophils Absolute: 299 {cells}/uL (ref 15–500)
Eosinophils Relative: 4.4 %
HCT: 43.2 % (ref 35.0–45.0)
Hemoglobin: 14.1 g/dL (ref 11.7–15.5)
Lymphs Abs: 2244 {cells}/uL (ref 850–3900)
MCH: 31.5 pg (ref 27.0–33.0)
MCHC: 32.6 g/dL (ref 32.0–36.0)
MCV: 96.6 fL (ref 80.0–100.0)
MPV: 10.1 fL (ref 7.5–12.5)
Monocytes Relative: 6.8 %
Neutro Abs: 3747 {cells}/uL (ref 1500–7800)
Neutrophils Relative %: 55.1 %
Platelets: 261 10*3/uL (ref 140–400)
RBC: 4.47 10*6/uL (ref 3.80–5.10)
RDW: 12 % (ref 11.0–15.0)
Total Lymphocyte: 33 %
WBC: 6.8 10*3/uL (ref 3.8–10.8)

## 2023-01-02 LAB — LIPID PANEL
Cholesterol: 199 mg/dL (ref <200)
HDL: 74 mg/dL (ref >=50)
LDL Cholesterol (Calc): 104 mg/dL — ABNORMAL HIGH
Non-HDL Cholesterol (Calc): 125 mg/dL (calc) (ref <130)
Total CHOL/HDL Ratio: 2.7 (calc) (ref <5.0)
Triglycerides: 117 mg/dL (ref <150)

## 2023-01-02 LAB — COMPLETE METABOLIC PANEL WITH GFR
AG Ratio: 1.7 (calc) (ref 1.0–2.5)
ALT: 14 U/L (ref 6–29)
AST: 19 U/L (ref 10–35)
Albumin: 4.2 g/dL (ref 3.6–5.1)
Alkaline phosphatase (APISO): 89 U/L (ref 37–153)
BUN: 14 mg/dL (ref 7–25)
CO2: 27 mmol/L (ref 20–32)
Calcium: 9.5 mg/dL (ref 8.6–10.4)
Chloride: 105 mmol/L (ref 98–110)
Creat: 0.71 mg/dL (ref 0.60–1.00)
Globulin: 2.5 g/dL (ref 1.9–3.7)
Glucose, Bld: 82 mg/dL (ref 65–99)
Potassium: 4.5 mmol/L (ref 3.5–5.3)
Sodium: 140 mmol/L (ref 135–146)
Total Bilirubin: 0.4 mg/dL (ref 0.2–1.2)
Total Protein: 6.7 g/dL (ref 6.1–8.1)
eGFR: 87 mL/min/{1.73_m2} (ref >=60)

## 2023-01-02 LAB — TSH: TSH: 3.25 m[IU]/L (ref 0.40–4.50)

## 2023-01-02 LAB — HEMOGLOBIN A1C
Hgb A1c MFr Bld: 5.4 %{Hb} (ref <5.7)
Mean Plasma Glucose: 108 mg/dL
eAG (mmol/L): 6 mmol/L

## 2023-01-04 ENCOUNTER — Encounter: Payer: Self-pay | Admitting: Family Medicine

## 2023-01-04 ENCOUNTER — Ambulatory Visit (INDEPENDENT_AMBULATORY_CARE_PROVIDER_SITE_OTHER): Payer: PPO | Admitting: Family Medicine

## 2023-01-04 VITALS — BP 128/60 | HR 70 | Ht 61.0 in | Wt 122.0 lb

## 2023-01-04 DIAGNOSIS — F5101 Primary insomnia: Secondary | ICD-10-CM | POA: Diagnosis not present

## 2023-01-04 DIAGNOSIS — Z0000 Encounter for general adult medical examination without abnormal findings: Secondary | ICD-10-CM | POA: Diagnosis not present

## 2023-01-04 DIAGNOSIS — E7800 Pure hypercholesterolemia, unspecified: Secondary | ICD-10-CM

## 2023-01-04 DIAGNOSIS — S0300XA Dislocation of jaw, unspecified side, initial encounter: Secondary | ICD-10-CM

## 2023-01-04 MED ORDER — TRAZODONE HCL 50 MG PO TABS
50.0000 mg | ORAL_TABLET | Freq: Every day | ORAL | 3 refills | Status: DC
Start: 2023-01-04 — End: 2024-01-06

## 2023-01-04 NOTE — Patient Instructions (Addendum)
Thank you for coming to the office today.  Lab results look good overall!  ----------------------  New rx Trazodone 50mg  nightly for insomnia / sleep, take every night, if needed can reduce to half tab. After 2-4 weeks may take full effect and help your mood and sleep.  If need can take the Diazepam Valium as needed if still difficulty sleeping etc.  Contact if questions, and we can follow-up 3 months.   Please schedule a Follow-up Appointment to: Return in about 3 months (around 04/06/2023) for 3 month follow-up mood / insomnia updates.  If you have any other questions or concerns, please feel free to call the office or send a message through MyChart. You may also schedule an earlier appointment if necessary.  Additionally, you may be receiving a survey about your experience at our office within a few days to 1 week by e-mail or mail. We value your feedback.  Saralyn Pilar, DO [facility], [place]

## 2023-01-04 NOTE — Progress Notes (Signed)
Subjective:    Patient ID: Kim Ayala, female    DOB: 1946-01-31, 77 y.o.   MRN: [id number]  Kim Ayala is a 77 y.o. female presenting on 01/04/2023 for Annual Exam   HPI  Here for Annual Physical and Lab orders today   Lifestyle / HYPERLIPIDEMIA - Reports no concerns. Stable lipids, LDL 108 last year 2022 - Not taking any cholesterol medication. Not interested. Lifestyle - Diet: improving - Gym 3 x week   Adjustment Disorder depression / anxiety, Insomnia Prior history of anxiety / adjustment to COVID pandemic and insomnia Taking Diazepam (valium) as needed only, rarely took, takes with good results PRN for insomnia.   She feels reduced energy TMJ, chronic recurrent, popping and pain Prior headaches triggered by it  She has nerve pain and sensation with pressure and numbness after dental implants.  Mind overactive at night She may sleep about 2 hours and then be awake at night She takes Diazepam 5mg  AS NEEDED     Health Maintenance: UTD on COVID19 Vaccine  DEXA - last done 2012. She plans to keep doing strengthening. She is not interested in repeating a DEXA scan.   Breast Cancer Screening - declines routine screening mammogram, last done 2012, declines despite counseling on benefits of cancer prevention.   Colon CA Screening - declined colonoscopy / Completed Cologuard 12/22/19  Due for Flu vaccine when ready.     01/04/2023    2:46 PM 11/30/2021   10:52 AM 12/06/2020    2:09 PM  Depression screen PHQ 2/9  Decreased Interest 0 0 0  Down, Depressed, Hopeless 0 1 1  PHQ - 2 Score 0 1 1  Altered sleeping 0 1 1  Tired, decreased energy 0 1 1  Change in appetite 0 0 0  Feeling bad or failure about yourself  0 0 0  Trouble concentrating 0 0 0  Moving slowly or fidgety/restless 0 0 0  Suicidal thoughts 0 0 0  PHQ-9 Score 0 3 3  Difficult doing work/chores Not difficult at all Not difficult at all Not difficult at all      01/04/2023    2:46 PM 11/30/2021    10:52 AM 12/06/2020    2:10 PM 11/04/2018    5:10 PM  GAD 7 : Generalized Anxiety Score  Nervous, Anxious, on Edge 0 0 0 1  Control/stop worrying 0 0 0 1  Worry too much - different things 0 0 0 1  Trouble relaxing 0 1 1 1   Restless 0 0 0 0  Easily annoyed or irritable 0 0 0 1  Afraid - awful might happen 0 1 1 1   Total GAD 7 Score 0 2 2 6   Anxiety Difficulty Not difficult at all Not difficult at all Not difficult at all Not difficult at all      Past Medical History:  Diagnosis Date   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   Past Surgical History:  Procedure Laterality Date   ABDOMINAL HYSTERECTOMY  1975   unsure of type   APPENDECTOMY  1975   CATARACT EXTRACTION  2009   CHOLECYSTECTOMY  1998   FRACTURE SURGERY  1996   bone   LAPAROSCOPY Bilateral 08/30/2014   salpingo-oophorectomy and adhesiolysis   RADICAL HYSTERECTOMY WITH TRANSPOSITION OF OVARIES     Social History   Socioeconomic History   Marital status: Divorced    Spouse name: Not on file   Number of children: Not on file   Years of education: Not on file  Highest education level: Not on file  Occupational History   Not on file  Tobacco Use   Smoking status: Never   Smokeless tobacco: Never  Substance and Sexual Activity   Alcohol use: No    Alcohol/week: 0.0 standard drinks of alcohol   Drug use: No   Sexual activity: Not Currently  Other Topics Concern   Not on file  Social History Narrative   Not on file   Social Determinants of Health   Financial Resource Strain: Not on file  Food Insecurity: Not on file  Transportation Needs: Not on file  Physical Activity: Not on file  Stress: Not on file  Social Connections: Not on file  Intimate Partner Violence: Not on file   Family History  Problem Relation Age of Onset   Heart disease Mother    Current Outpatient Medications on File Prior to Visit  Medication Sig   acetaminophen (TYLENOL) 500 MG tablet Take 2 tablets (1,000 mg total)  by mouth every 8 (eight) hours as needed.   Calcium 150 MG TABS Take 150 mg by mouth daily.   diazepam (VALIUM) 5 MG tablet TAKE 1 TABLET BY MOUTH AT BEDTIME AS NEEDED FOR ANXIETY   fluticasone (FLONASE) 50 MCG/ACT nasal spray Place 2 sprays into both nostrils daily. Use for 4-6 weeks then stop and use seasonally or as needed.   Multiple Vitamins-Minerals (ONE-A-DAY WOMENS 50+ PO) Take by mouth.   No current facility-administered medications on file prior to visit.    Review of Systems  Constitutional:  Negative for activity change, appetite change, chills, diaphoresis, fatigue and fever.  HENT:  Negative for congestion and hearing loss.   Eyes:  Negative for visual disturbance.  Respiratory:  Negative for cough, chest tightness, shortness of breath and wheezing.   Cardiovascular:  Negative for chest pain, palpitations and leg swelling.  Gastrointestinal:  Negative for abdominal pain, constipation, diarrhea, nausea and vomiting.  Genitourinary:  Negative for dysuria, frequency and hematuria.  Musculoskeletal:  Negative for arthralgias and neck pain.  Skin:  Negative for rash.  Neurological:  Negative for dizziness, weakness, light-headedness, numbness and headaches.  Hematological:  Negative for adenopathy.  Psychiatric/Behavioral:  Negative for behavioral problems, dysphoric mood and sleep disturbance.    Per HPI unless specifically indicated above      Objective:    BP 128/60   Pulse 70   Ht 5\' 1"  (1.549 m)   Wt 122 lb (55.3 kg)   SpO2 98%   BMI 23.05 kg/m   Wt Readings from Last 3 Encounters:  01/04/23 122 lb (55.3 kg)  05/17/22 122 lb (55.3 kg)  04/09/22 122 lb (55.3 kg)    Physical Exam Vitals and nursing note reviewed.  Constitutional:      General: She is not in acute distress.    Appearance: Normal appearance. She is well-developed. She is not diaphoretic.     Comments: Well-appearing, comfortable, cooperative  HENT:     Head: Normocephalic and atraumatic.   Eyes:     General:        Right eye: No discharge.        Left eye: No discharge.     Conjunctiva/sclera: Conjunctivae normal.  Cardiovascular:     Rate and Rhythm: Normal rate.  Pulmonary:     Effort: Pulmonary effort is normal.  Skin:    General: Skin is warm and dry.     Findings: No erythema or rash.  Neurological:     Mental Status: She is alert  and oriented to person, place, and time.  Psychiatric:        Mood and Affect: Mood normal.        Behavior: Behavior normal.        Thought Content: Thought content normal.     Comments: Well groomed, good eye contact, normal speech and thoughts      Results for orders placed or performed in visit on 01/01/23  TSH  Result Value Ref Range   TSH 3.25 0.40 - 4.50 mIU/L  Hemoglobin A1c  Result Value Ref Range   Hgb A1c MFr Bld 5.4 <5.7 % of total Hgb   Mean Plasma Glucose 108 mg/dL   eAG (mmol/L) 6.0 mmol/L  Lipid panel  Result Value Ref Range   Cholesterol 199 <200 mg/dL   HDL 74 > OR = 50 mg/dL   Triglycerides 960 <454 mg/dL   LDL Cholesterol (Calc) 104 (H) mg/dL (calc)   Total CHOL/HDL Ratio 2.7 <5.0 (calc)   Non-HDL Cholesterol (Calc) 125 <130 mg/dL (calc)  CBC with Differential/Platelet  Result Value Ref Range   WBC 6.8 3.8 - 10.8 Thousand/uL   RBC 4.47 3.80 - 5.10 Million/uL   Hemoglobin 14.1 11.7 - 15.5 g/dL   HCT 09.8 11.9 - 14.7 %   MCV 96.6 80.0 - 100.0 fL   MCH 31.5 27.0 - 33.0 pg   MCHC 32.6 32.0 - 36.0 g/dL   RDW 82.9 56.2 - 13.0 %   Platelets 261 140 - 400 Thousand/uL   MPV 10.1 7.5 - 12.5 fL   Neutro Abs 3,747 1,500 - 7,800 cells/uL   Lymphs Abs 2,244 850 - 3,900 cells/uL   Absolute Monocytes 462 200 - 950 cells/uL   Eosinophils Absolute 299 15 - 500 cells/uL   Basophils Absolute 48 0 - 200 cells/uL   Neutrophils Relative % 55.1 %   Total Lymphocyte 33.0 %   Monocytes Relative 6.8 %   Eosinophils Relative 4.4 %   Basophils Relative 0.7 %  COMPLETE METABOLIC PANEL WITH GFR  Result Value Ref  Range   Glucose, Bld 82 65 - 99 mg/dL   BUN 14 7 - 25 mg/dL   Creat 8.65 7.84 - 6.96 mg/dL   eGFR 87 > OR = 60 EX/BMW/4.13K4   BUN/Creatinine Ratio SEE NOTE: 6 - 22 (calc)   Sodium 140 135 - 146 mmol/L   Potassium 4.5 3.5 - 5.3 mmol/L   Chloride 105 98 - 110 mmol/L   CO2 27 20 - 32 mmol/L   Calcium 9.5 8.6 - 10.4 mg/dL   Total Protein 6.7 6.1 - 8.1 g/dL   Albumin 4.2 3.6 - 5.1 g/dL   Globulin 2.5 1.9 - 3.7 g/dL (calc)   AG Ratio 1.7 1.0 - 2.5 (calc)   Total Bilirubin 0.4 0.2 - 1.2 mg/dL   Alkaline phosphatase (APISO) 89 37 - 153 U/L   AST 19 10 - 35 U/L   ALT 14 6 - 29 U/L      Assessment & Plan:   Problem List Items Addressed This Visit     Insomnia   Relevant Medications   traZODone (DESYREL) 50 MG tablet   Pure hypercholesterolemia   TMJ (dislocation of temporomandibular joint)   Other Visit Diagnoses     Annual physical exam    -  Primary      Updated Health Maintenance information Reviewed recent lab results with patient Encouraged improvement to lifestyle with diet and exercise  Lab results look good overall!  New rx Trazodone  50mg  nightly for insomnia / sleep, take every night, if needed can reduce to half tab. After 2-4 weeks may take full effect and help your mood and sleep.  If need can take the Diazepam Valium as needed if still difficulty sleeping etc.   No orders of the defined types were placed in this encounter.     Meds ordered this encounter  Medications   traZODone (DESYREL) 50 MG tablet    Sig: Take 1 tablet (50 mg total) by mouth at bedtime.    Dispense:  30 tablet    Refill:  3      Follow up plan: Return in about 3 months (around 04/06/2023) for 3 month follow-up mood / insomnia updates.    Saralyn Pilar, DO [facility] 01/04/2023, 2:54 PM

## 2023-01-24 ENCOUNTER — Other Ambulatory Visit: Payer: Self-pay | Admitting: Internal Medicine

## 2023-01-24 DIAGNOSIS — F4323 Adjustment disorder with mixed anxiety and depressed mood: Secondary | ICD-10-CM

## 2023-01-24 DIAGNOSIS — F5101 Primary insomnia: Secondary | ICD-10-CM

## 2023-01-24 NOTE — Telephone Encounter (Signed)
Requested medications are due for refill today.  yes  Requested medications are on the active medications list.  yes  Last refill. 12/18/2022 #30 0 rf  Future visit scheduled.   no  Notes to clinic.  Refill not delegated.    Requested Prescriptions  Pending Prescriptions Disp Refills   diazepam (VALIUM) 5 MG tablet [Pharmacy Med Name: DIAZEPAM 5 MG TAB] 30 tablet     Sig: TAKE 1 TABLET BY MOUTH AT BEDTIME AS NEEDED FOR ANXIETY     Not Delegated - Psychiatry: Anxiolytics/Hypnotics 2 Failed - 01/24/2023 12:52 PM      Failed - This refill cannot be delegated      Failed - Urine Drug Screen completed in last 360 days      Passed - Patient is not pregnant      Passed - Valid encounter within last 6 months    Recent Outpatient Visits           2 weeks ago Annual physical exam   [place] [facility] Smitty Cords, DO   9 months ago Acute non-recurrent frontal sinusitis   [place] [facility] Smitty Cords, DO   11 months ago Tinnitus of right ear   [place] [facility] Smit[facility] Cords, DO   1 year ago Annual physical exam   [place] [facility] Smitty Cords, DO   1 year ago Eustachian tube dysfunction, right   [place] [facility] [place], Netta Neat, [place]

## 2023-02-15 DIAGNOSIS — H26493 Other secondary cataract, bilateral: Secondary | ICD-10-CM | POA: Diagnosis not present

## 2023-02-15 DIAGNOSIS — H04123 Dry eye syndrome of bilateral lacrimal glands: Secondary | ICD-10-CM | POA: Diagnosis not present

## 2023-02-25 ENCOUNTER — Other Ambulatory Visit: Payer: Self-pay | Admitting: Internal Medicine

## 2023-02-25 ENCOUNTER — Other Ambulatory Visit: Payer: Self-pay | Admitting: Family Medicine

## 2023-02-25 DIAGNOSIS — F4323 Adjustment disorder with mixed anxiety and depressed mood: Secondary | ICD-10-CM

## 2023-02-25 DIAGNOSIS — F5101 Primary insomnia: Secondary | ICD-10-CM

## 2023-02-25 NOTE — Telephone Encounter (Signed)
Medication Refill - Medication:  diazepam (VALIUM) 5 MG tablet [[id number]]   Patient reporting she has a root canal tomorrow    Has the patient contacted their pharmacy? Yes.   (Agent: If no, request that the patient contact the pharmacy for the refill. If patient does not wish to contact the pharmacy document the reason why and proceed with request.) (Agent: If yes, when and what did the pharmacy advise?)  Preferred Pharmacy (with phone number or street name):  [facility] [place], [place] - [address] Phone: [phone]  Fax: [phone]     Has the patient been seen for an appointment in the last year OR does the patient have an upcoming appointment? Yes.    Agent: Please be advised that RX refills may take up to 3 business days. We ask that you follow-up with your pharmacy.

## 2023-02-26 MED ORDER — DIAZEPAM 5 MG PO TABS: ORAL_TABLET | ORAL | 2 refills | Status: DC

## 2023-02-26 NOTE — Telephone Encounter (Signed)
Requested medication (s) are due for refill today - yes  Requested medication (s) are on the active medication list -yes  Future visit scheduled -no  Last refill: 01/25/23 #30  Notes to clinic: non delegated Rx  Requested Prescriptions  Pending Prescriptions Disp Refills   diazepam (VALIUM) 5 MG tablet [Pharmacy Med Name: DIAZEPAM 5 MG TAB] 30 tablet     Sig: TAKE 1 TABLET BY MOUTH AT BEDTIME AS NEEDED FOR ANXIETY     Not Delegated - Psychiatry: Anxiolytics/Hypnotics 2 Failed - 02/25/2023  1:40 PM      Failed - This refill cannot be delegated      Failed - Urine Drug Screen completed in last 360 days      Passed - Patient is not pregnant      Passed - Valid encounter within last 6 months    Recent Outpatient Visits           1 month ago Annual physical exam   [place] [place], Netta Neat, DO   10 months ago Acute non-recurrent frontal sinusitis   [place] [facility] Smitty Cords, DO   12 months ago Tinnitus of right ear   [place] [facility] Smitty Cords, DO   1 year ago Annual physical exam   [place] [facility] Smitty Cords, DO   1 year ago Eustachian tube dysfunction, right   [place] [facility], Netta Neat, DO                 Requested Prescriptions  Pending Prescriptions Disp Refills   diazepam (VALIUM) 5 MG tablet [Pharmacy Med Name: DIAZEPAM 5 MG TAB] 30 tablet     Sig: TAKE 1 TABLET BY MOUTH AT BEDTIME AS NEEDED FOR ANXIETY     Not Delegated - Psychiatry: Anxiolytics/Hypnotics 2 Failed - 02/25/2023  1:40 PM      Failed - This refill cannot be delegated      Failed - Urine Drug Screen completed in last 360 days      Passed - Patient is not pregnant      Passed - Valid encounter within last 6 months    Recent Outpatient Visits           1 month ago Annual physical exam   [place]  [facility] Smitty Cords, DO   10 months ago Acute non-recurrent frontal sinusitis   [place] [facility] Smitty Cords, DO   12 months ago Tinnitus of right ear   [place] [facility] Smitty Cords, DO   1 year ago Annual physical exam   [place] [facility] Smitty Cords, DO   1 year ago Eustachian tube dysfunction, right   [place] [facility] [place], Netta Neat, [place]

## 2023-02-26 NOTE — Telephone Encounter (Signed)
Requested medication (s) are due for refill today - yes  Requested medication (s) are on the active medication list -yes  Future visit scheduled -no  Last refill: 01/25/23 #30  Notes to clinic: non delegated Rx  Requested Prescriptions  Pending Prescriptions Disp Refills   diazepam (VALIUM) 5 MG tablet 30 tablet 0     Not Delegated - Psychiatry: Anxiolytics/Hypnotics 2 Failed - 02/25/2023  5:40 PM      Failed - This refill cannot be delegated      Failed - Urine Drug Screen completed in last 360 days      Passed - Patient is not pregnant      Passed - Valid encounter within last 6 months    Recent Outpatient Visits           1 month ago Annual physical exam   [place] [facility] Smitty Cords, DO   10 months ago Acute non-recurrent frontal sinusitis   [place] [facility] Smitty Cords, DO   12 months ago Tinnitus of right ear   [facility] Smitty Cords, DO   1 year ago Annual physical exam   [place] [facility] Smitty Cords, DO   1 year ago Eustachian tube dysfunction, right   [place] [facility] [place], Netta Neat, DO                 Requested Prescriptions  Pending Prescriptions Disp Refills   diazepam (VALIUM) 5 MG tablet 30 tablet 0     Not Delegated - Psychiatry: Anxiolytics/Hypnotics 2 Failed - 02/25/2023  5:40 PM      Failed - This refill cannot be delegated      Failed - Urine Drug Screen completed in last 360 days      Passed - Patient is not pregnant      Passed - Valid encounter within last 6 months    Recent Outpatient Visits           1 month ago Annual physical exam   [place] [facility] Smitty Cords, DO   10 months ago Acute non-recurrent frontal sinusitis   [place] [facility] Smitty Cords, DO   12 months ago  Tinnitus of right ear   [place] [facility] Smitty Cords, DO   1 year ago Annual physical exam   [place] [facility] Smitty Cords, DO   1 year ago Eustachian tube dysfunction, right   [facility] [place], Netta Neat, [place]

## 2023-05-21 DIAGNOSIS — H04123 Dry eye syndrome of bilateral lacrimal glands: Secondary | ICD-10-CM | POA: Diagnosis not present

## 2023-05-21 DIAGNOSIS — H26493 Other secondary cataract, bilateral: Secondary | ICD-10-CM | POA: Diagnosis not present

## 2023-06-27 ENCOUNTER — Other Ambulatory Visit: Payer: Self-pay | Admitting: Family Medicine

## 2023-06-27 DIAGNOSIS — F5101 Primary insomnia: Secondary | ICD-10-CM

## 2023-06-27 DIAGNOSIS — F4323 Adjustment disorder with mixed anxiety and depressed mood: Secondary | ICD-10-CM

## 2023-06-28 NOTE — Telephone Encounter (Signed)
Requested medication (s) are due for refill today: Yes  Requested medication (s) are on the active medication list: Yes  Last refill:  02/26/23  Future visit scheduled: No  Notes to clinic:  Unable to refill per protocol, cannot delegate.      Requested Prescriptions  Pending Prescriptions Disp Refills   diazepam (VALIUM) 5 MG tablet [Pharmacy Med Name: DIAZEPAM 5 MG TAB] 30 tablet     Sig: TAKE 1 TABLET BY MOUTH AT BEDTIME AS NEEDED FOR ANXIETY     Not Delegated - Psychiatry: Anxiolytics/Hypnotics 2 Failed - 06/28/2023  1:44 PM      Failed - This refill cannot be delegated      Failed - Urine Drug Screen completed in last 360 days      Passed - Patient is not pregnant      Passed - Valid encounter within last 6 months    Recent Outpatient Visits           5 months ago Annual physical exam   [place] [facility] Smitty Cords, DO   1 year ago Acute non-recurrent frontal sinusitis   [place] [facility] Smitty Cords, DO   1 year ago Tinnitus of right ear   [place] [facility] Smitty Cords, DO   1 year ago Annual physical exam   [place] [facility] Smitty Cords, DO   1 year ago Eustachian tube dysfunction, right   [facility] [place], [place], [place]

## 2023-07-11 ENCOUNTER — Telehealth: Payer: Self-pay

## 2023-07-11 DIAGNOSIS — J3089 Other allergic rhinitis: Secondary | ICD-10-CM

## 2023-07-11 MED ORDER — AZELASTINE HCL 0.15 % NA SOLN: 1.0000 | Freq: Two times a day (BID) | NASAL | 2 refills | Status: DC

## 2023-07-11 NOTE — Telephone Encounter (Signed)
 Copied from CRM [id number]. Topic: Clinical - Medication Question >> Jul 11, 2023  2:50 PM Dondra Prader E wrote: Reason for CRM: Pt called requesting to have her PCP prescribe this medication Azelastine HCL Nasal spray 0.15%   [facility] [place], [place] - [address] [place] [address] Phone: [phone] Fax: [phone]  Best contact: [phone]

## 2023-07-11 NOTE — Addendum Note (Signed)
 Addended by: Smitty Cords on: 07/11/2023 04:57 PM   Modules accepted: Orders

## 2023-07-12 NOTE — Progress Notes (Signed)
 PA started   [employer] (Key: [id number]) Rx #: [id number] Need Help? Call us at [phone] Status New (Not sent to plan) Drug Astepro 0.15% solution ePA cloud logo Form [facility] Electronic Prior Authorization Form 2017 NCPDP Original Claim Info [phone]

## 2023-08-13 ENCOUNTER — Encounter: Payer: Self-pay | Admitting: Family Medicine

## 2023-08-13 ENCOUNTER — Ambulatory Visit: Payer: Self-pay

## 2023-08-13 ENCOUNTER — Ambulatory Visit (INDEPENDENT_AMBULATORY_CARE_PROVIDER_SITE_OTHER): Admitting: Family Medicine

## 2023-08-13 VITALS — BP 134/68 | HR 62 | Ht 61.0 in | Wt 123.0 lb

## 2023-08-13 DIAGNOSIS — R197 Diarrhea, unspecified: Secondary | ICD-10-CM | POA: Diagnosis not present

## 2023-08-13 DIAGNOSIS — A084 Viral intestinal infection, unspecified: Secondary | ICD-10-CM | POA: Diagnosis not present

## 2023-08-13 NOTE — Progress Notes (Signed)
 Subjective:    Patient ID: Kim Ayala, female    DOB: October 29, 1945, 78 y.o.   MRN: [id number]  Kim Ayala is a 78 y.o. female presenting on 08/13/2023 for GI Problem (Viral Illness / Gastroenteritis)   HPI  Discussed the use of AI scribe software for clinical note transcription with the patient, who gave verbal consent to proceed.  History of Present Illness   Kim Ayala is a 78 year old female who presents with diarrhea and concerns about dehydration.  Approximately two weeks ago, she experienced an initial episode of symptoms including a sore throat, high fever, and extreme fatigue. The fever was described as nearly reaching a high level, and she felt extremely drained and unable to move due to fatigue. These symptoms persisted for about four days before improving.  Following the initial symptoms, she developed diarrhea, for which she took Imodium as per the instructions on the box, leading to some improvement. However, the diarrhea recurred this morning, prompting her concern about potential dehydration. No current fever, but she is worried about dehydration due to the diarrhea. She has been consuming sports drinks to maintain hydration and notes that her toenails and fingernails appear very dry. Her fluid intake includes four to six glasses of water daily and two small bottles of sports drinks, each containing eight to ten ounces.          01/04/2023    2:46 PM 11/30/2021   10:52 AM 12/06/2020    2:09 PM  Depression screen PHQ 2/9  Decreased Interest 0 0 0  Down, Depressed, Hopeless 0 1 1  PHQ - 2 Score 0 1 1  Altered sleeping 0 1 1  Tired, decreased energy 0 1 1  Change in appetite 0 0 0  Feeling bad or failure about yourself  0 0 0  Trouble concentrating 0 0 0  Moving slowly or fidgety/restless 0 0 0  Suicidal thoughts 0 0 0  PHQ-9 Score 0 3 3  Difficult doing work/chores Not difficult at all Not difficult at all Not difficult at all       01/04/2023    2:46 PM 11/30/2021    10:52 AM 12/06/2020    2:10 PM 11/04/2018    5:10 PM  GAD 7 : Generalized Anxiety Score  Nervous, Anxious, on Edge 0 0 0 1  Control/stop worrying 0 0 0 1  Worry too much - different things 0 0 0 1  Trouble relaxing 0 1 1 1   Restless 0 0 0 0  Easily annoyed or irritable 0 0 0 1  Afraid - awful might happen 0 1 1 1   Total GAD 7 Score 0 2 2 6   Anxiety Difficulty Not difficult at all Not difficult at all Not difficult at all Not difficult at all    Social History   Tobacco Use   Smoking status: Never   Smokeless tobacco: Never  Substance Use Topics   Alcohol use: No    Alcohol/week: 0.0 standard drinks of alcohol   Drug use: No    Review of Systems Per HPI unless specifically indicated above     Objective:    BP 134/68 (BP Location: Left Arm, Patient Position: Sitting, Cuff Size: Normal)   Pulse 62   Ht 5\' 1"  (1.549 m)   Wt 123 lb (55.8 kg)   SpO2 99%   BMI 23.24 kg/m   Wt Readings from Last 3 Encounters:  08/13/23 123 lb (55.8 kg)  01/04/23 122 lb (55.3 kg)  05/17/22 122 lb (55.3 kg)    Physical Exam Vitals and nursing note reviewed.  Constitutional:      General: She is not in acute distress.    Appearance: Normal appearance. She is well-developed. She is not diaphoretic.     Comments: Well-appearing, comfortable, cooperative  HENT:     Head: Normocephalic and atraumatic.  Eyes:     General:        Right eye: No discharge.        Left eye: No discharge.     Conjunctiva/sclera: Conjunctivae normal.  Cardiovascular:     Rate and Rhythm: Normal rate.  Pulmonary:     Effort: Pulmonary effort is normal.  Skin:    General: Skin is warm and dry.     Findings: No erythema or rash.  Neurological:     Mental Status: She is alert and oriented to person, place, and time.  Psychiatric:        Mood and Affect: Mood normal.        Behavior: Behavior normal.        Thought Content: Thought content normal.     Comments: Well groomed, good eye contact, normal speech  and thoughts     Results for orders placed or performed in visit on 01/01/23  TSH   Collection Time: 01/01/23 10:25 AM  Result Value Ref Range   TSH 3.25 0.40 - 4.50 mIU/L  Hemoglobin A1c   Collection Time: 01/01/23 10:25 AM  Result Value Ref Range   Hgb A1c MFr Bld 5.4 <5.7 % of total Hgb   Mean Plasma Glucose 108 mg/dL   eAG (mmol/L) 6.0 mmol/L  Lipid panel   Collection Time: 01/01/23 10:25 AM  Result Value Ref Range   Cholesterol 199 <200 mg/dL   HDL 74 > OR = 50 mg/dL   Triglycerides 409 <811 mg/dL   LDL Cholesterol (Calc) 104 (H) mg/dL (calc)   Total CHOL/HDL Ratio 2.7 <5.0 (calc)   Non-HDL Cholesterol (Calc) 125 <130 mg/dL (calc)  CBC with Differential/Platelet   Collection Time: 01/01/23 10:25 AM  Result Value Ref Range   WBC 6.8 3.8 - 10.8 Thousand/uL   RBC 4.47 3.80 - 5.10 Million/uL   Hemoglobin 14.1 11.7 - 15.5 g/dL   HCT 91.4 78.2 - 95.6 %   MCV 96.6 80.0 - 100.0 fL   MCH 31.5 27.0 - 33.0 pg   MCHC 32.6 32.0 - 36.0 g/dL   RDW 21.3 08.6 - 57.8 %   Platelets 261 140 - 400 Thousand/uL   MPV 10.1 7.5 - 12.5 fL   Neutro Abs 3,747 1,500 - 7,800 cells/uL   Lymphs Abs 2,244 850 - 3,900 cells/uL   Absolute Monocytes 462 200 - 950 cells/uL   Eosinophils Absolute 299 15 - 500 cells/uL   Basophils Absolute 48 0 - 200 cells/uL   Neutrophils Relative % 55.1 %   Total Lymphocyte 33.0 %   Monocytes Relative 6.8 %   Eosinophils Relative 4.4 %   Basophils Relative 0.7 %  COMPLETE METABOLIC PANEL WITH GFR   Collection Time: 01/01/23 10:25 AM  Result Value Ref Range   Glucose, Bld 82 65 - 99 mg/dL   BUN 14 7 - 25 mg/dL   Creat 4.69 6.29 - 5.28 mg/dL   eGFR 87 > OR = 60 UX/LKG/4.01U2   BUN/Creatinine Ratio SEE NOTE: 6 - 22 (calc)   Sodium 140 135 - 146 mmol/L   Potassium 4.5 3.5 - 5.3 mmol/L   Chloride 105 98 -  110 mmol/L   CO2 27 20 - 32 mmol/L   Calcium 9.5 8.6 - 10.4 mg/dL   Total Protein 6.7 6.1 - 8.1 g/dL   Albumin 4.2 3.6 - 5.1 g/dL   Globulin 2.5 1.9 - 3.7  g/dL (calc)   AG Ratio 1.7 1.0 - 2.5 (calc)   Total Bilirubin 0.4 0.2 - 1.2 mg/dL   Alkaline phosphatase (APISO) 89 37 - 153 U/L   AST 19 10 - 35 U/L   ALT 14 6 - 29 U/L      Assessment & Plan:   Problem List Items Addressed This Visit   None Visit Diagnoses       Viral gastroenteritis    -  Primary     Diarrhea, unspecified type            Acute Viral Gastroenteritis   She is experiencing a second episode of diarrhea within two weeks, likely due to a viral syndrome  Initial symptoms of sore throat, high fever, fatigue, and cough have resolved. The diarrhea persists without fever. She is concerned about dehydration but maintains adequate hydration with sports drinks and water. Vital signs indicate no dehydration.   - Continue hydration with sports drinks and water.   - Increase fluid intake to 40-80 ounces per day.   - Use Imodium for short-term relief if necessary.   - Switch to peppermint oil capsules (IBguard) for long-term management of diarrhea.   - Consume soups and solid foods to help form bowel movements.   - Consider probiotics to support digestive health.    - Schedule a physical examination in August, as per the annual schedule.        No orders of the defined types were placed in this encounter.   No orders of the defined types were placed in this encounter.   Follow up plan: Return if symptoms worsen or fail to improve.   Saralyn Pilar, DO [facility] 08/13/2023, 3:17 PM

## 2023-08-13 NOTE — Telephone Encounter (Signed)
 Chief Complaint: diarrhea Symptoms: diarrhea Frequency: intermittently for 2-3 weeks Pertinent Negatives: Patient denies fever, N/V, dizziness,  Disposition: [] ED /[] Urgent Care (no appt availability in office) / [x] Appointment(In office/virtual)/ []  [place] [facility]/ [] Home Care/ [] Refused Recommended Disposition /[] [place] [facility]/ []  Follow-up with PCP Additional Notes: Pt states that she has had a virus. Pt states that she now has diarrhea. Pt states that she took imodium about 2-3 days ago and that helped, but diarrhea is back today. Took imodium again this morning, has not had anymore diarrhea since then. Pt states that she is feeling very tired but feels she has been keeping up on her fluids. Pt scheduled today with PCP.    Copied from CRM [id number]. Topic: Clinical - Medical Advice >> Aug 13, 2023  1:13 PM Carlatta H wrote: Reason for CRM: Patient would like a call back from the nurse regarding being prescribed a medication for diarrhea//Patient has had diarrhea for the past week// Reason for Disposition  [1] Mild diarrhea (e.g., 1-3 or more stools than normal in past 24 hours) without known cause AND [2] present >  7 days  Answer Assessment - Initial Assessment Questions 1. DIARRHEA SEVERITY: "How bad is the diarrhea?" "How many more stools have you had in the past 24 hours than normal?"    - NO DIARRHEA (SCALE 0)   - MILD (SCALE 1-3): Few loose or mushy BMs; increase of 1-3 stools over normal daily number of stools; mild increase in ostomy output.   -  MODERATE (SCALE 4-7): Increase of 4-6 stools daily over normal; moderate increase in ostomy output.   -  SEVERE (SCALE 8-10; OR "WORST POSSIBLE"): Increase of 7 or more stools daily over normal; moderate increase in ostomy output; incontinence.     Mild to no diarrhea each day over the last 2 weeks 2. ONSET: "When did the diarrhea begin?"      About 2 weeks ago, but then resolved 3. BM CONSISTENCY: "How loose or watery is  the diarrhea?"      both 4. VOMITING: "Are you also vomiting?" If Yes, ask: "How many times in the past 24 hours?"      denies 5. ABDOMEN PAIN: "Are you having any abdomen pain?" If Yes, ask: "What does it feel like?" (e.g., crampy, dull, intermittent, constant)      Not really 7. ORAL INTAKE: If vomiting, "Have you been able to drink liquids?" "How much liquids have you had in the past 24 hours?"     Yes drinking liquids 8. HYDRATION: "Any signs of dehydration?" (e.g., dry mouth [not just dry lips], too weak to stand, dizziness, new weight loss) "When did you last urinate?"     I feel tired 9. EXPOSURE: "Have you traveled to a foreign country recently?" "Have you been exposed to anyone with diarrhea?" "Could you have eaten any food that was spoiled?"     denies 10. ANTIBIOTIC USE: "Are you taking antibiotics now or have you taken antibiotics in the past 2 months?"       denies 11. OTHER SYMPTOMS: "Do you have any other symptoms?" (e.g., fever, blood in stool)       denies  Protocols used: Diarrhea-A-AH

## 2023-08-13 NOTE — Patient Instructions (Addendum)
 Thank you for coming to the office today.  Phase off Imodium  Keep on the sports drinks rehydration solution  Continue water  IBGard OTC Peppermint Oil (Triple Coated Capsule) 180mg  take one 3 times daily to reduce diarrhea  Yogurt / probiotics can help regulate bowels.  - Increase fiber in diet (high fiber foods = vegetables, leafy greens, oats/grains) - May take OTC Fiber supplement (metamucil powder or pill/gummy) - May try OTC Probiotic   Please schedule a Follow-up Appointment to: Return if symptoms worsen or fail to improve.  If you have any other questions or concerns, please feel free to call the office or send a message through MyChart. You may also schedule an earlier appointment if necessary.  Additionally, you may be receiving a survey about your experience at our office within a few days to 1 week by e-mail or mail. We value your feedback.  Saralyn Pilar, DO [facility], [place]

## 2023-08-27 ENCOUNTER — Ambulatory Visit: Admitting: Dermatology

## 2023-10-17 ENCOUNTER — Other Ambulatory Visit: Payer: Self-pay | Admitting: Family Medicine

## 2023-10-17 DIAGNOSIS — F5101 Primary insomnia: Secondary | ICD-10-CM

## 2023-10-17 DIAGNOSIS — F4323 Adjustment disorder with mixed anxiety and depressed mood: Secondary | ICD-10-CM

## 2023-10-18 NOTE — Telephone Encounter (Signed)
 Requested medication (s) are due for refill today - yes  Requested medication (s) are on the active medication list -yes  Future visit scheduled -no  Last refill: 06/28/23 #30 2RF  Notes to clinic: non delegated Rx  Requested Prescriptions  Pending Prescriptions Disp Refills   diazepam  (VALIUM ) 5 MG tablet [Pharmacy Med Name: DIAZEPAM  5 MG TAB] 30 tablet     Sig: TAKE 1 TABLET BY MOUTH AT BEDTIME AS NEEDED FOR ANXIETY     Not Delegated - Psychiatry: Anxiolytics/Hypnotics 2 Failed - 10/18/2023 12:04 PM      Failed - This refill cannot be delegated      Failed - Urine Drug Screen completed in last 360 days      Failed - Valid encounter within last 6 months    Recent Outpatient Visits           2 months ago Viral gastroenteritis   [place] [facility] Raina Bunting, DO              Passed - Patient is not pregnant         Requested Prescriptions  Pending Prescriptions Disp Refills   diazepam  (VALIUM ) 5 MG tablet [Pharmacy Med Name: DIAZEPAM  5 MG TAB] 30 tablet     Sig: TAKE 1 TABLET BY MOUTH AT BEDTIME AS NEEDED FOR ANXIETY     Not Delegated - Psychiatry: Anxiolytics/Hypnotics 2 Failed - 10/18/2023 12:04 PM      Failed - This refill cannot be delegated      Failed - Urine Drug Screen completed in last 360 days      Failed - Valid encounter within last 6 months    Recent Outpatient Visits           2 months ago Viral gastroenteritis   [place] [facility] Raina Bunting, [place]              Passed - Patient is not pregnant

## 2023-10-28 ENCOUNTER — Ambulatory Visit: Admitting: Dermatology

## 2023-10-28 ENCOUNTER — Encounter: Payer: Self-pay | Admitting: Dermatology

## 2023-10-28 DIAGNOSIS — W908XXA Exposure to other nonionizing radiation, initial encounter: Secondary | ICD-10-CM | POA: Diagnosis not present

## 2023-10-28 DIAGNOSIS — I8393 Asymptomatic varicose veins of bilateral lower extremities: Secondary | ICD-10-CM | POA: Diagnosis not present

## 2023-10-28 DIAGNOSIS — L820 Inflamed seborrheic keratosis: Secondary | ICD-10-CM | POA: Diagnosis not present

## 2023-10-28 DIAGNOSIS — L821 Other seborrheic keratosis: Secondary | ICD-10-CM

## 2023-10-28 DIAGNOSIS — D2372 Other benign neoplasm of skin of left lower limb, including hip: Secondary | ICD-10-CM

## 2023-10-28 DIAGNOSIS — L814 Other melanin hyperpigmentation: Secondary | ICD-10-CM | POA: Diagnosis not present

## 2023-10-28 DIAGNOSIS — D2371 Other benign neoplasm of skin of right lower limb, including hip: Secondary | ICD-10-CM | POA: Diagnosis not present

## 2023-10-28 DIAGNOSIS — L578 Other skin changes due to chronic exposure to nonionizing radiation: Secondary | ICD-10-CM

## 2023-10-28 DIAGNOSIS — D1801 Hemangioma of skin and subcutaneous tissue: Secondary | ICD-10-CM

## 2023-10-28 DIAGNOSIS — I781 Nevus, non-neoplastic: Secondary | ICD-10-CM | POA: Diagnosis not present

## 2023-10-28 DIAGNOSIS — D239 Other benign neoplasm of skin, unspecified: Secondary | ICD-10-CM

## 2023-10-28 DIAGNOSIS — Z1283 Encounter for screening for malignant neoplasm of skin: Secondary | ICD-10-CM

## 2023-10-28 DIAGNOSIS — D229 Melanocytic nevi, unspecified: Secondary | ICD-10-CM

## 2023-10-28 NOTE — Patient Instructions (Addendum)

## 2023-10-28 NOTE — Progress Notes (Signed)
 Follow-Up Visit   Subjective  Kim Ayala is a 78 y.o. female who presents for the following: Skin Cancer Screening and Full Body Skin Exam Hx of isk   The patient presents for Total-Body Skin Exam (TBSE) for skin cancer screening and mole check. The patient has spots, moles and lesions to be evaluated, some may be new or changing and the patient may have concern these could be cancer.  The following portions of the chart were reviewed this encounter and updated as appropriate: medications, allergies, medical history  Review of Systems:  No other skin or systemic complaints except as noted in HPI or Assessment and Plan.  Objective  Well appearing patient in no apparent distress; mood and affect are within normal limits.  A full examination was performed including scalp, head, eyes, ears, nose, lips, neck, chest, axillae, abdomen, back, buttocks, bilateral upper extremities, bilateral lower extremities, hands, feet, fingers, toes, fingernails, and toenails. All findings within normal limits unless otherwise noted below.   Relevant physical exam findings are noted in the Assessment and Plan.  right lateral elbow x 1, right temple x 1, (2) Erythematous stuck-on, waxy papule or plaque  Assessment & Plan   SKIN CANCER SCREENING PERFORMED TODAY.  ACTINIC DAMAGE - Chronic condition, secondary to cumulative UV/sun exposure - diffuse scaly erythematous macules with underlying dyspigmentation - Recommend daily broad spectrum sunscreen SPF 30+ to sun-exposed areas, reapply every 2 hours as needed.  - Staying in the shade or wearing long sleeves, sun glasses (UVA+UVB protection) and wide brim hats (4-inch brim around the entire circumference of the hat) are also recommended for sun protection.  - Call for new or changing lesions.  LENTIGINES, SEBORRHEIC KERATOSES, HEMANGIOMAS - Benign normal skin lesions - Benign-appearing - Call for any changes Discussed  MELANOCYTIC NEVI - Tan-brown  and/or pink-flesh-colored symmetric macules and papules - Benign appearing on exam today - Observation - Call clinic for new or changing moles - Recommend daily use of broad spectrum spf 30+ sunscreen to sun-exposed areas.   DERMATOFIBROMA Exam: Firm pink/brown papulenodule with dimple sign. B/l lower legs  Treatment Plan: A dermatofibroma is a benign growth possibly related to trauma, such as an insect bite, cut from shaving, or inflamed acne-type bump.  Treatment options to remove include shave or excision with resulting scar and risk of recurrence.  Since benign-appearing and not bothersome, will observe for now.   Varicose Veins/Spider Veins - Dilated blue, purple or red veins at the lower extremities - Reassured - Smaller vessels can be treated by sclerotherapy (a procedure to inject a medicine into the veins to make them disappear) if desired, but the treatment is not covered by insurance. Larger vessels may be covered if symptomatic and we would refer to vascular surgeon if treatment desired.  INFLAMED SEBORRHEIC KERATOSIS (2) right lateral elbow x 1, right temple x 1, (2) Symptomatic, irritating, patient would like treated. Destruction of lesion - right lateral elbow x 1, right temple x 1, (2) Complexity: simple   Destruction method: cryotherapy   Informed consent: discussed and consent obtained   Timeout:  patient name, date of birth, surgical site, and procedure verified Lesion destroyed using liquid nitrogen: Yes   Region frozen until ice ball extended beyond lesion: Yes   Outcome: patient tolerated procedure well with no complications   Post-procedure details: wound care instructions given   Return for 1 - 2 year tbse .  IRandee Busing, CMA, am acting as scribe for Celine Collard, MD.  Documentation: I have reviewed the above documentation for accuracy and completeness, and I agree with the above.  Celine Collard, MD

## 2023-12-19 ENCOUNTER — Encounter: Payer: Self-pay | Admitting: Internal Medicine

## 2023-12-19 ENCOUNTER — Ambulatory Visit: Payer: Self-pay

## 2023-12-19 ENCOUNTER — Ambulatory Visit (INDEPENDENT_AMBULATORY_CARE_PROVIDER_SITE_OTHER): Admitting: Internal Medicine

## 2023-12-19 VITALS — BP 130/70 | HR 80 | Temp 99.7°F | Ht 61.0 in | Wt 125.0 lb

## 2023-12-19 DIAGNOSIS — J988 Other specified respiratory disorders: Secondary | ICD-10-CM

## 2023-12-19 DIAGNOSIS — B9789 Other viral agents as the cause of diseases classified elsewhere: Secondary | ICD-10-CM | POA: Diagnosis not present

## 2023-12-19 NOTE — Telephone Encounter (Signed)
Will discuss at upcoming appointment today 

## 2023-12-19 NOTE — Patient Instructions (Signed)
 Viral Respiratory Infection A respiratory infection is an illness that affects part of the respiratory system, such as the lungs, nose, or throat. A respiratory infection that is caused by a virus is called a viral respiratory infection. Common types of viral respiratory infections include: A cold. The flu (influenza). A respiratory syncytial virus (RSV) infection. What are the causes? This condition is caused by a virus. The virus may spread through contact with droplets or direct contact with infected people or their mucus or secretions. The virus may spread from person to person (is contagious). What are the signs or symptoms? Symptoms of this condition include: A stuffy or runny nose. A sore throat or cough. Shortness of breath or difficulty breathing. Yellow or green mucus (sputum). Other symptoms may include: A fever. Sweating or chills. Fatigue. Achy muscles. A headache. How is this diagnosed? This condition may be diagnosed based on: Your symptoms. A physical exam. Testing of secretions from the nose or throat. Chest X-ray. How is this treated? This condition may be treated with medicines, such as: Antiviral medicine. This may shorten the length of time a person has symptoms. Expectorants. These make it easier to cough up mucus. Decongestant nasal sprays. Acetaminophen or NSAIDs, such as ibuprofen, to relieve fever and pain. Antibiotic medicines are not prescribed for viral infections.This is because antibiotics are designed to kill bacteria. They do not kill viruses. Follow these instructions at home: Managing pain and congestion Take over-the-counter and prescription medicines only as told by your health care provider. If you have a sore throat, gargle with a mixture of salt and water 3-4 times a day or as needed. To make salt water, completely dissolve -1 tsp (3-6 g) of salt in 1 cup (237 mL) of warm water. Use nose drops made from salt water to ease congestion and  soften raw skin around your nose. Take 2 tsp (10 mL) of honey at bedtime to lessen coughing at night. Do not give honey to children who are younger than 1 year. Drink enough fluid to keep your urine pale yellow. This helps prevent dehydration and helps loosen up mucus. General instructions  Rest as much as possible. Do not drink alcohol. Do not use any products that contain nicotine or tobacco. These products include cigarettes, chewing tobacco, and vaping devices, such as e-cigarettes. If you need help quitting, ask your health care provider. Keep all follow-up visits. This is important. How is this prevented?     Get an annual flu shot. You may get the flu shot in late summer, fall, or winter. Ask your health care provider when you should get your flu shot. Avoid spreading your infection to other people. If you are sick: Wash your hands with soap and water often, especially after you cough or sneeze. Wash for at least 20 seconds. If soap and water are not available, use alcohol-based hand sanitizer. Cover your mouth when you cough. Cover your nose and mouth when you sneeze. Do not share cups or eating utensils. Clean commonly used objects often. Clean commonly touched surfaces. Stay home from work or school as told by your health care provider. Avoid contact with people who are sick during cold and flu season. This is generally fall and winter. Contact a health care provider if: Your symptoms last for 10 days or longer. Your symptoms get worse over time. You have severe sinus pain in your face or forehead. The glands in your jaw or neck become very swollen. You have shortness of breath. Get  help right away if you: Feel pain or pressure in your chest. Have trouble breathing. Faint or feel like you will faint. Have severe and persistent vomiting. Feel confused or disoriented. These symptoms may represent a serious problem that is an emergency. Do not wait to see if the symptoms will  go away. Get medical help right away. Call your local emergency services (911 in the [place] Do not drive yourself to the hospital. Summary A respiratory infection is an illness that affects part of the respiratory system, such as the lungs, nose, or throat. A respiratory infection that is caused by a virus is called a viral respiratory infection. Common types of viral respiratory infections include a cold, influenza, and respiratory syncytial virus (RSV) infection. Symptoms of this condition include a stuffy or runny nose, cough, fatigue, achy muscles, sore throat, and fevers or chills. Antibiotic medicines are not prescribed for viral infections. This is because antibiotics are designed to kill bacteria. They are not effective against viruses. This information is not intended to replace advice given to you by your health care provider. Make sure you discuss any questions you have with your health care provider. Document Revised: 08/04/2020 Document Reviewed: 08/04/2020 Elsevier Patient Education  2024 [employer].

## 2023-12-19 NOTE — Progress Notes (Signed)
 Subjective:    Patient ID: Kim Ayala, female    DOB: 11/01/1945, 78 y.o.   MRN: [id number]  HPI  Discussed the use of AI scribe software for clinical note transcription with the patient, who gave verbal consent to proceed.  Kim Ayala is a 78 year old female who presents with upper respiratory symptoms including sore throat and cough.  Upper respiratory symptoms - Duration of symptoms: approximately six days - Severe sore throat - Bothersome and painful cough - Headache - Rhinorrhea - Nasal congestion - Fever and myalgias - No dyspnea, nausea, vomiting, diarrhea, or otalgia - Symptoms showed slight improvement but have worsened again, especially in the last one to two days  Medication use and response - Using Tylenol  and over-the-counter medications including DayQuil and NyQuil - Medications have provided some relief, particularly aiding sleep at night - Concern about taking excessive medication  Infectious exposure risk - Possible exposure to RSV, not confirmed  Pulmonary history - No history of asthma or COPD       Review of Systems   Past Medical History:  Diagnosis Date   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 30 tablet 0   Azelastine  HCl 0.15 % SOLN Place 1 spray into the nose 2 (two) times daily. (Patient not taking: Reported on 10/28/2023) 30 mL 2   Calcium 150 MG TABS Take 150 mg by mouth daily.     diazepam  (VALIUM ) 5 MG tablet TAKE 1 TABLET BY MOUTH AT BEDTIME AS NEEDED FOR ANXIETY 30 tablet 0   fluticasone  (FLONASE ) 50 MCG/ACT nasal spray Place 2 sprays into both nostrils daily. Use for 4-6 weeks then stop and use seasonally or as needed. 16 g 3   Multiple Vitamins-Minerals (ONE-A-DAY WOMENS 50+ PO) Take by mouth.     traZODone  (DESYREL ) 50 MG tablet Take 1 tablet (50  mg total) by mouth at bedtime. (Patient not taking: Reported on 10/28/2023) 30 tablet 3   No current facility-administered medications for this visit.    Allergies  Allergen Reactions   Asa [Aspirin]    Prednisone  Diarrhea    Family History  Problem Relation Age of Onset   Heart disease Mother     Social History   Socioeconomic History   Marital status: Divorced    Spouse name: Not on file   Number of children: Not on file   Years of education: Not on file   Highest education level: Not on file  Occupational History   Not on file  Tobacco Use   Smoking status: Never   Smokeless tobacco: Never  Substance and Sexual Activity   Alcohol use: No    Alcohol/week: 0.0 standard drinks of alcohol   Drug use: No   Sexual activity: Not Currently  Other Topics Concern   Not on file  Social History Narrative   Not on file   Social Drivers of Health   Financial Resource Strain: Not on file  Food Insecurity: Not on file  Transportation Needs: Not on file  Physical Activity: Not on file  Stress: Not on file  Social Connections: Not on file  Intimate Partner Violence: Not on file     Constitutional: Pt reports headache ,fever. Denies fever, malaise, fatigue, or abrupt weight changes.  HEENT: Pt reports runny nose, nasal congestion and sore throat. Denies eye pain, eye  redness, ear pain, ringing in the ears, wax buildup, bloody nose. Respiratory: Pt reports cough. Denies difficulty breathing, shortness of breath, or sputum production.   Cardiovascular: Denies chest pain, chest tightness, palpitations or swelling in the hands or feet.  Gastrointestinal: Denies abdominal pain, bloating, constipation, diarrhea or blood in the stool.  Musculoskeletal: Pt reports body aches. Denies decrease in range of motion, difficulty with gait, or joint swelling.  Skin: Denies redness, rashes, lesions or ulcercations.  No other specific complaints in a complete review of systems (except as  listed in HPI above).      Objective:   Physical Exam BP 130/70 (BP Location: Left Arm, Patient Position: Sitting, Cuff Size: Normal)   Pulse 80   Temp 99.7 F (37.6 C)   Ht 5' 1 (1.549 m)   Wt 125 lb (56.7 kg)   SpO2 100%   BMI 23.62 kg/m   Wt Readings from Last 3 Encounters:  08/13/23 123 lb (55.8 kg)  01/04/23 122 lb (55.3 kg)  05/17/22 122 lb (55.3 kg)    General: Appears her stated age, appears unwell, in NAD. Skin: Warm, dry and intact. No rashes noted. HEENT: Head: normal shape and size, no sinus tenderness noted; Eyes: sclera white, no icterus, conjunctiva pink, PERRLA and EOMs intact; Nose: mucosa pink and moist, septum midline; Throat/Mouth: Teeth present, mucosa erythematous and moist, + PND, no exudate, lesions or ulcerations noted.  Neck: No adenopathy noted but tender with palpation Cardiovascular: Normal rate and rhythm.  Pulmonary/Chest: Normal effort and positive vesicular breath sounds. No respiratory distress. No wheezes, rales or ronchi noted.  Musculoskeletal: No difficulty with gait.  Neurological: Alert and oriented.   BMET    Component Value Date/Time   NA 140 01/01/2023 1025   NA 143 09/23/2015 0822   NA 142 08/26/2014 1314   K 4.5 01/01/2023 1025   K 3.6 08/26/2014 1314   CL 105 01/01/2023 1025   CL 107 08/26/2014 1314   CO2 27 01/01/2023 1025   CO2 29 08/26/2014 1314   GLUCOSE 82 01/01/2023 1025   GLUCOSE 95 08/26/2014 1314   BUN 14 01/01/2023 1025   BUN 16 09/23/2015 0822   BUN 19 08/26/2014 1314   CREATININE 0.71 01/01/2023 1025   CALCIUM 9.5 01/01/2023 1025   CALCIUM 9.0 08/26/2014 1314   GFRNONAA 73 11/02/2019 0928   GFRAA 84 11/02/2019 0928    Lipid Panel     Component Value Date/Time   CHOL 199 01/01/2023 1025   CHOL 162 09/23/2015 0822   TRIG 117 01/01/2023 1025   HDL 74 01/01/2023 1025   HDL 59 09/23/2015 0822   CHOLHDL 2.7 01/01/2023 1025   VLDL 25 07/24/2016 0001   LDLCALC 104 (H) 01/01/2023 1025    CBC     Component Value Date/Time   WBC 6.8 01/01/2023 1025   RBC 4.47 01/01/2023 1025   HGB 14.1 01/01/2023 1025   HGB 14.7 09/23/2015 0822   HCT 43.2 01/01/2023 1025   HCT 41.6 09/23/2015 0822   PLT 261 01/01/2023 1025   PLT 243 09/23/2015 0822   MCV 96.6 01/01/2023 1025   MCV 93 09/23/2015 0822   MCV 96 08/26/2014 1314   MCH 31.5 01/01/2023 1025   MCHC 32.6 01/01/2023 1025   RDW 12.0 01/01/2023 1025   RDW 13.2 09/23/2015 0822   RDW 12.8 08/26/2014 1314   LYMPHSABS 2,244 01/01/2023 1025   LYMPHSABS 2.6 09/23/2015 0822   MONOABS 427 07/24/2016 0001   EOSABS 299 01/01/2023 1025  EOSABS 0.4 09/23/2015 0822   BASOSABS 48 01/01/2023 1025   BASOSABS 0.0 09/23/2015 0822    Hgb A1C Lab Results  Component Value Date   HGBA1C 5.4 01/01/2023            Assessment & Plan:  Assessment and Plan    Acute upper respiratory infection with pharyngitis and fever Symptoms suggest viral etiology. No bacterial infection evidence. Testing for RSV and COVID-19 unnecessary for management. Antibiotics not indicated.  - Continue acetaminophen , DayQuil, NyQuil for symptoms. - She declines Rx for prednisone  for symptom management - Advise rest, hydration. - Instruct to call if symptoms worsen, especially for bacterial infection. - Consider low-dose penicillin if symptoms worsen over weekend.     Follow-up with your PCP as previously scheduled Angeline Laura, NP

## 2023-12-19 NOTE — Telephone Encounter (Signed)
 FYI Only or Action Required?: FYI only for provider.  Patient was last seen in primary care on 08/13/2023 by Edman Marsa PARAS, DO.  Called Nurse Triage reporting Cough.  Symptoms began 4 days ago.  Interventions attempted: OTC medications: Daytime Flu and Cold and Rest, hydration, or home remedies.  Symptoms are: gradually worsening.  Triage Disposition: See Physician Within 24 Hours  Patient/caregiver understands and will follow disposition?: Yes             Copied from CRM #[id number]. Topic: Clinical - Red Word Triage >> Dec 19, 2023  9:06 AM Ivette P wrote: [facility] that prompted transfer to Nurse Triage: pt has a deep cough in the chest, with a low grade fever. Was exposed to RSV, pt is so weak. Really tired to even drive. Reason for Disposition  SEVERE coughing spells (e.g., whooping sound after coughing, vomiting after coughing)  Answer Assessment - Initial Assessment Questions 1. ONSET: When did the cough begin?      X 4 days 2. SEVERITY: How bad is the cough today?      Mod-Severe 3. SPUTUM: Describe the color of your sputum (e.g., none, dry cough; clear, white, yellow, green)     Clear, mucousy 4. HEMOPTYSIS: Are you coughing up any blood? If Yes, ask: How much? (e.g., flecks, streaks, tablespoons, etc.)     None 5. DIFFICULTY BREATHING: Are you having difficulty breathing? If Yes, ask: How bad is it? (e.g., mild, moderate, severe)      None 6. FEVER: Do you have a fever? If Yes, ask: What is your temperature, how was it measured, and when did it start?     99.0 7. CARDIAC HISTORY: Do you have any history of heart disease? (e.g., heart attack, congestive heart failure)      None 8. LUNG HISTORY: Do you have any history of lung disease?  (e.g., pulmonary embolus, asthma, emphysema)     None 10. OTHER SYMPTOMS: Do you have any other symptoms? (e.g., runny nose, wheezing, chest pain)       Deep constant cough  Protocols used: Cough  - Acute Productive-A-AH

## 2023-12-24 ENCOUNTER — Ambulatory Visit: Payer: Self-pay

## 2023-12-24 ENCOUNTER — Other Ambulatory Visit: Payer: Self-pay | Admitting: Family Medicine

## 2023-12-24 DIAGNOSIS — L0100 Impetigo, unspecified: Secondary | ICD-10-CM

## 2023-12-24 MED ORDER — MUPIROCIN CALCIUM 2 % EX CREA: 1.0000 | TOPICAL_CREAM | Freq: Two times a day (BID) | CUTANEOUS | 0 refills | Status: DC

## 2023-12-24 NOTE — Telephone Encounter (Signed)
 Patient was just seen for URI by Angeline Laura, FNP on 12/19/23. It sounds like she has some areas of raw skin sore under nose, possibly from URI drainage congestion etc. I will agree to send Mupirocin  topical cream for healing these skin spots. It is a rx version of similar antibiotic topical as neosporin but it is the stronger rx version.  Rx sent to pharmacy  Marsa Officer, DO [facility] 12/24/2023, 1:15 PM

## 2023-12-24 NOTE — Telephone Encounter (Signed)
 Patient notified prescription has been sent into the pharmacy.

## 2023-12-24 NOTE — Addendum Note (Signed)
 Addended by: EDMAN MARSA PARAS on: 12/24/2023 01:15 PM   Modules accepted: Orders

## 2023-12-24 NOTE — Telephone Encounter (Signed)
 FYI Only or Action Required?: Action required by provider: would like an RX for sores under nose.  Patient was last seen in primary care on 12/19/2023 by Antonette Angeline ORN, NP.  Called Nurse Triage reporting Rash.  Symptoms began several days ago.  Interventions attempted: Nothing.  Symptoms are: gradually worsening.  Triage Disposition: Home Care  Patient/caregiver understands and will follow disposition?: No, wishes to speak with PCP    Copied from CRM #[id number]. Topic: Clinical - Red Word Triage >> Dec 24, 2023 11:59 AM Berwyn MATSU wrote: Red Word that prompted transfer to Nurse Triage: breakout bumps, chest deep cough with bubbling, and increased mucous Reason for Disposition  1 or 2 impetigo sores  Answer Assessment - Initial Assessment Questions 1. APPEARANCE: What does the rash look like?      Little blisters under the nose going to the lip, swollen and red 2. LOCATION: Where is the rash located?      Nose going to lip 3. NUMBER: How many sores are there?      multiple 4. SIZE: How big is the largest sore? (e.g., inches, cm; or compare to size of a coin)     Smaller than an eraser  5. ONSET: When did the sores start?     Four days ago 6. OTHER SYMPTOMS: Do you have any other symptoms? (e.g., fever, pain)     no 7. PREGNANCY: Is there any chance you are pregnant? When was your last menstrual period?     na  Protocols used: Impetigo (Infected Sore)-A-AH

## 2023-12-31 ENCOUNTER — Telehealth: Payer: Self-pay | Admitting: Family Medicine

## 2023-12-31 NOTE — Telephone Encounter (Unsigned)
 Copied from CRM [id number]. Topic: Appointments - Scheduling Inquiry for Clinic >> Dec 31, 2023  4:29 PM Tiffini S wrote: Reason for CRM: Patient have received a survey for [employer] for 12/19/23- states that she never seen the provider. Said she never met or spoke with [employer]. Please call to follow up with the patient at [phone].

## 2024-01-06 ENCOUNTER — Ambulatory Visit (INDEPENDENT_AMBULATORY_CARE_PROVIDER_SITE_OTHER): Admitting: Family Medicine

## 2024-01-06 ENCOUNTER — Encounter: Payer: Self-pay | Admitting: Family Medicine

## 2024-01-06 VITALS — BP 138/60 | HR 63 | Wt 125.4 lb

## 2024-01-06 DIAGNOSIS — J0110 Acute frontal sinusitis, unspecified: Secondary | ICD-10-CM | POA: Diagnosis not present

## 2024-01-06 DIAGNOSIS — G9331 Postviral fatigue syndrome: Secondary | ICD-10-CM | POA: Diagnosis not present

## 2024-01-06 MED ORDER — AMOXICILLIN-POT CLAVULANATE 875-125 MG PO TABS: 1.0000 | ORAL_TABLET | Freq: Two times a day (BID) | ORAL | 0 refills | Status: DC

## 2024-01-06 NOTE — Progress Notes (Signed)
 Subjective:    Patient ID: Kim Ayala, female    DOB: 1946-04-25, 78 y.o.   MRN: [id number]  Kim Ayala is a 78 y.o. female presenting on 01/06/2024 for Fatigue (Has not been the same since having RSV, feels as if head was in a barel) and skin issues   HPI  Discussed the use of AI scribe software for clinical note transcription with the patient, who gave verbal consent to proceed.  History of Present Illness   Kim Ayala is a 78 year old female who presents with persistent sinus and respiratory symptoms.  Sinusitis - Persistent sinus symptoms for approximately four weeks - Significant sinus pressure, described as feeling like a balloon up inside' her head - Constant sinus symptoms without resolution - No use of prescription treatments or antibiotics for sinus symptoms - Not currently using any specific nasal sprays; type of nasal spray previously used is unspecified - Symptoms have persisted and worsened over three weeks - Initial onset of severe cough and sore throat in early August - Respiratory symptoms have improved and are no longer present - No current cough or sore throat  Constitutional symptoms - Extreme fatigue and persistent feeling of being drained - Unable to regain strength - No fever or other systemic symptoms  Anxiety Insomnia - Previously used Valium  for sleep issues, discontinued over a month ago - She remains off. Asking about this in future if needed.          01/04/2023    2:46 PM 11/30/2021   10:52 AM 12/06/2020    2:09 PM  Depression screen PHQ 2/9  Decreased Interest 0 0 0  Down, Depressed, Hopeless 0 1 1  PHQ - 2 Score 0 1 1  Altered sleeping 0 1 1  Tired, decreased energy 0 1 1  Change in appetite 0 0 0  Feeling bad or failure about yourself  0 0 0  Trouble concentrating 0 0 0  Moving slowly or fidgety/restless 0 0 0  Suicidal thoughts 0 0 0  PHQ-9 Score 0 3 3  Difficult doing work/chores Not difficult at all Not difficult at all Not  difficult at all       01/04/2023    2:46 PM 11/30/2021   10:52 AM 12/06/2020    2:10 PM 11/04/2018    5:10 PM  GAD 7 : Generalized Anxiety Score  Nervous, Anxious, on Edge 0 0 0 1  Control/stop worrying 0 0 0 1  Worry too much - different things 0 0 0 1  Trouble relaxing 0 1 1 1   Restless 0 0 0 0  Easily annoyed or irritable 0 0 0 1  Afraid - awful might happen 0 1 1 1   Total GAD 7 Score 0 2 2 6   Anxiety Difficulty Not difficult at all Not difficult at all Not difficult at all Not difficult at all    Social History   Tobacco Use   Smoking status: Never   Smokeless tobacco: Never  Substance Use Topics   Alcohol use: No    Alcohol/week: 0.0 standard drinks of alcohol   Drug use: No    Review of Systems Per HPI unless specifically indicated above     Objective:    BP 138/60 (BP Location: Left Arm, Cuff Size: Normal)   Pulse 63   Wt 125 lb 6.4 oz (56.9 kg)   SpO2 98%   BMI 23.69 kg/m   Wt Readings from Last 3 Encounters:  01/06/24 125 lb 6.4  oz (56.9 kg)  12/19/23 125 lb (56.7 kg)  08/13/23 123 lb (55.8 kg)    Physical Exam Vitals and nursing note reviewed.  Constitutional:      General: She is not in acute distress.    Appearance: She is well-developed. She is not diaphoretic.     Comments: Well-appearing, comfortable, cooperative  HENT:     Head: Normocephalic and atraumatic.     Right Ear: Tympanic membrane, ear canal and external ear normal. There is no impacted cerumen.     Left Ear: Tympanic membrane, ear canal and external ear normal. There is no impacted cerumen.  Eyes:     General:        Right eye: No discharge.        Left eye: No discharge.     Conjunctiva/sclera: Conjunctivae normal.  Neck:     Thyroid : No thyromegaly.  Cardiovascular:     Rate and Rhythm: Normal rate and regular rhythm.     Heart sounds: Normal heart sounds. No murmur heard. Pulmonary:     Effort: Pulmonary effort is normal. No respiratory distress.     Breath sounds:  Normal breath sounds. No wheezing or rales.  Musculoskeletal:        General: Normal range of motion.     Cervical back: Normal range of motion and neck supple.  Lymphadenopathy:     Cervical: No cervical adenopathy.  Skin:    General: Skin is warm and dry.     Findings: No erythema or rash.  Neurological:     Mental Status: She is alert and oriented to person, place, and time.  Psychiatric:        Behavior: Behavior normal.     Comments: Well groomed, good eye contact, normal speech and thoughts     Results for orders placed or performed in visit on 01/01/23  TSH   Collection Time: 01/01/23 10:25 AM  Result Value Ref Range   TSH 3.25 0.40 - 4.50 mIU/L  Hemoglobin A1c   Collection Time: 01/01/23 10:25 AM  Result Value Ref Range   Hgb A1c MFr Bld 5.4 <5.7 % of total Hgb   Mean Plasma Glucose 108 mg/dL   eAG (mmol/L) 6.0 mmol/L  Lipid panel   Collection Time: 01/01/23 10:25 AM  Result Value Ref Range   Cholesterol 199 <200 mg/dL   HDL 74 > OR = 50 mg/dL   Triglycerides 882 <849 mg/dL   LDL Cholesterol (Calc) 104 (H) mg/dL (calc)   Total CHOL/HDL Ratio 2.7 <5.0 (calc)   Non-HDL Cholesterol (Calc) 125 <130 mg/dL (calc)  CBC with Differential/Platelet   Collection Time: 01/01/23 10:25 AM  Result Value Ref Range   WBC 6.8 3.8 - 10.8 Thousand/uL   RBC 4.47 3.80 - 5.10 Million/uL   Hemoglobin 14.1 11.7 - 15.5 g/dL   HCT 56.7 64.9 - 54.9 %   MCV 96.6 80.0 - 100.0 fL   MCH 31.5 27.0 - 33.0 pg   MCHC 32.6 32.0 - 36.0 g/dL   RDW 87.9 88.9 - 84.9 %   Platelets 261 140 - 400 Thousand/uL   MPV 10.1 7.5 - 12.5 fL   Neutro Abs 3,747 1,500 - 7,800 cells/uL   Lymphs Abs 2,244 850 - 3,900 cells/uL   Absolute Monocytes 462 200 - 950 cells/uL   Eosinophils Absolute 299 15 - 500 cells/uL   Basophils Absolute 48 0 - 200 cells/uL   Neutrophils Relative % 55.1 %   Total Lymphocyte 33.0 %   Monocytes  Relative 6.8 %   Eosinophils Relative 4.4 %   Basophils Relative 0.7 %  COMPLETE  METABOLIC PANEL WITH GFR   Collection Time: 01/01/23 10:25 AM  Result Value Ref Range   Glucose, Bld 82 65 - 99 mg/dL   BUN 14 7 - 25 mg/dL   Creat 9.28 9.39 - 8.99 mg/dL   eGFR 87 > OR = 60 fO/fpw/8.26f7   BUN/Creatinine Ratio SEE NOTE: 6 - 22 (calc)   Sodium 140 135 - 146 mmol/L   Potassium 4.5 3.5 - 5.3 mmol/L   Chloride 105 98 - 110 mmol/L   CO2 27 20 - 32 mmol/L   Calcium  9.5 8.6 - 10.4 mg/dL   Total Protein 6.7 6.1 - 8.1 g/dL   Albumin 4.2 3.6 - 5.1 g/dL   Globulin 2.5 1.9 - 3.7 g/dL (calc)   AG Ratio 1.7 1.0 - 2.5 (calc)   Total Bilirubin 0.4 0.2 - 1.2 mg/dL   Alkaline phosphatase (APISO) 89 37 - 153 U/L   AST 19 10 - 35 U/L   ALT 14 6 - 29 U/L      Assessment & Plan:   Problem List Items Addressed This Visit   None Visit Diagnoses       Acute non-recurrent frontal sinusitis    -  Primary   Relevant Medications   amoxicillin -clavulanate (AUGMENTIN ) 875-125 MG tablet     Post viral syndrome             Acute bacterial sinusitis Symptoms consistent with bacterial sinusitis post-viral infection now lingering 3 weeks. No prior antibiotic treatment. - Prescribed Augmentin  (amoxicillin /clavulanate) twice daily for 10 days. - Discussed Atrovent nasal spray option; she declined.  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 Focus on excluding other causes. - Ordered blood work for vitamin D, B12, thyroid  function, and electrolytes. - Encouraged continuation of current multivitamin regimen.        No orders of the defined types were placed in this encounter.   Meds ordered this encounter  Medications   amoxicillin -clavulanate (AUGMENTIN ) 875-125 MG tablet    Sig: Take 1 tablet by mouth 2 (two) times daily.    Dispense:  20 tablet    Refill:  0    Follow up plan: Return in about 9 days (around 01/15/2024).  Fasting Labs after next visit 01/15/24 to check all including Vitamin D and B12    Myrtle Haller,  DO [facility] 01/06/2024, 4:17 PM

## 2024-01-06 NOTE — Patient Instructions (Addendum)
 Thank you for coming to the office today.   Start Augmentin  antibiotic twice a day for 10 days  We will see you back in a few weeks or less to do the labs + physical  Please schedule a Follow-up Appointment to: Return in about 9 days (around 01/15/2024).  If you have any other questions or concerns, please feel free to call the office or send a message through MyChart. You may also schedule an earlier appointment if necessary.  Additionally, you may be receiving a survey about your experience at our office within a few days to 1 week by e-mail or mail. We value your feedback.  Marsa Officer, DO [facility], [place]

## 2024-01-15 ENCOUNTER — Encounter: Payer: Self-pay | Admitting: Family Medicine

## 2024-01-15 ENCOUNTER — Ambulatory Visit (INDEPENDENT_AMBULATORY_CARE_PROVIDER_SITE_OTHER): Admitting: Family Medicine

## 2024-01-15 VITALS — BP 112/74 | HR 60 | Ht 61.0 in | Wt 125.4 lb

## 2024-01-15 DIAGNOSIS — R7309 Other abnormal glucose: Secondary | ICD-10-CM | POA: Diagnosis not present

## 2024-01-15 DIAGNOSIS — E559 Vitamin D deficiency, unspecified: Secondary | ICD-10-CM | POA: Diagnosis not present

## 2024-01-15 DIAGNOSIS — F5101 Primary insomnia: Secondary | ICD-10-CM | POA: Diagnosis not present

## 2024-01-15 DIAGNOSIS — E538 Deficiency of other specified B group vitamins: Secondary | ICD-10-CM | POA: Diagnosis not present

## 2024-01-15 DIAGNOSIS — Z0000 Encounter for general adult medical examination without abnormal findings: Secondary | ICD-10-CM | POA: Diagnosis not present

## 2024-01-15 DIAGNOSIS — E7800 Pure hypercholesterolemia, unspecified: Secondary | ICD-10-CM | POA: Diagnosis not present

## 2024-01-15 NOTE — Progress Notes (Signed)
 Subjective:    Patient ID: Kim Ayala, female    DOB: 1945/12/15, 78 y.o.   MRN: [id number]  Kim Ayala is a 78 y.o. female presenting on 01/15/2024 for Annual Exam   HPI  Discussed the use of AI scribe software for clinical note transcription with the patient, who gave verbal consent to proceed.  History of Present Illness   Kim Ayala is a 78 year old female who presents with persistent ear symptoms and sleep disturbances.  Sinusitis Right ear hearing loss and associated symptoms - Persistent right ear symptoms for approximately two weeks - Inability to hear in the right ear - Symptoms began in the ear and subsequently affected the head - Currently taking antibiotics - Describes feeling weak  Insomnia / Chronic Sleep disturbances - Difficulty sleeping. 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 - Previously on Valium  with success - Not currently taking Valium  due to concerns about habit-forming potential - Trazodone  previously tried but was ineffective  Immunization and supplementation concerns - Routinely receives influenza vaccination every October - Inquires about appropriate timing for pneumococcal vaccination in relation to influenza vaccination - Takes [employer] Women's Vitamins Over Fifty - Interested in checking vitamin D  and B12 levels to assess supplement effectiveness     Lifestyle / HYPERLIPIDEMIA - Not taking any cholesterol medication. Not interested. Lifestyle - Diet: improving - Gym 3 x week   HM  DEXA - last done 2012. She plans to keep doing strengthening. She is not interested in repeating a DEXA scan.   Breast Cancer Screening - declines routine screening mammogram, last done 2012, declines despite counseling on benefits of cancer prevention.   Colon CA Screening - declined colonoscopy / Completed Cologuard 12/22/19   Due for Flu vaccine when ready. In October 2025  Prevnar-20 vaccine due, return      01/04/2023    2:46 PM 11/30/2021    10:52 AM 12/06/2020    2:09 PM  Depression screen PHQ 2/9  Decreased Interest 0 0 0  Down, Depressed, Hopeless 0 1 1  PHQ - 2 Score 0 1 1  Altered sleeping 0 1 1  Tired, decreased energy 0 1 1  Change in appetite 0 0 0  Feeling bad or failure about yourself  0 0 0  Trouble concentrating 0 0 0  Moving slowly or fidgety/restless 0 0 0  Suicidal thoughts 0 0 0  PHQ-9 Score 0 3 3  Difficult doing work/chores Not difficult at all Not difficult at all Not difficult at all       01/04/2023    2:46 PM 11/30/2021   10:52 AM 12/06/2020    2:10 PM 11/04/2018    5:10 PM  GAD 7 : Generalized Anxiety Score  Nervous, Anxious, on Edge 0 0 0 1  Control/stop worrying 0 0 0 1  Worry too much - different things 0 0 0 1  Trouble relaxing 0 1 1 1   Restless 0 0 0 0  Easily annoyed or irritable 0 0 0 1  Afraid - awful might happen 0 1 1 1   Total GAD 7 Score 0 2 2 6   Anxiety Difficulty Not difficult at all Not difficult at all Not difficult at all Not difficult at all     Past Medical History:  Diagnosis Date   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   Past Surgical History:  Procedure Laterality Date   ABDOMINAL HYSTERECTOMY  1975   unsure of type   APPENDECTOMY  1975   CATARACT EXTRACTION  2009   CHOLECYSTECTOMY  1998   FRACTURE SURGERY  1996   bone   LAPAROSCOPY Bilateral 08/30/2014   salpingo-oophorectomy and adhesiolysis   RADICAL HYSTERECTOMY WITH TRANSPOSITION OF OVARIES     Social History   Socioeconomic History   Marital status: Divorced    Spouse name: Not on file   Number of children: Not on file   Years of education: Not on file   Highest education level: Not on file  Occupational History   Not on file  Tobacco Use   Smoking status: Never   Smokeless tobacco: Never  Substance and Sexual Activity   Alcohol use: No    Alcohol/week: 0.0 standard drinks of alcohol   Drug use: No   Sexual activity: Not Currently  Other Topics Concern   Not on file  Social  History Narrative   Not on file   Social Drivers of Health   Financial Resource Strain: Not on file  Food Insecurity: Not on file  Transportation Needs: Not on file  Physical Activity: Not on file  Stress: Not on file  Social Connections: Not on file  Intimate Partner Violence: Not on file   Family History  Problem Relation Age of Onset   Heart disease Mother    Current Outpatient Medications on File Prior to Visit  Medication Sig   acetaminophen  (TYLENOL ) 500 MG tablet Take 2 tablets (1,000 mg total) by mouth every 8 (eight) hours as needed.   amoxicillin -clavulanate (AUGMENTIN ) 875-125 MG tablet Take 1 tablet by mouth 2 (two) times daily.   Calcium  150 MG TABS Take 150 mg by mouth daily.   fluticasone  (FLONASE ) 50 MCG/ACT nasal spray Place 2 sprays into both nostrils daily. Use for 4-6 weeks then stop and use seasonally or as needed. (Patient not taking: Reported on 01/06/2024)   Multiple Vitamins-Minerals (ONE-A-DAY WOMENS 50+ PO) Take by mouth.   No current facility-administered medications on file prior to visit.    Review of Systems  Constitutional:  Negative for activity change, appetite change, chills, diaphoresis, fatigue and fever.  HENT:  Positive for hearing loss. Negative for congestion.   Eyes:  Negative for visual disturbance.  Respiratory:  Negative for cough, chest tightness, shortness of breath and wheezing.   Cardiovascular:  Negative for chest pain, palpitations and leg swelling.  Gastrointestinal:  Negative for abdominal pain, constipation, diarrhea, nausea and vomiting.  Genitourinary:  Negative for dysuria, frequency and hematuria.  Musculoskeletal:  Negative for arthralgias and neck pain.  Skin:  Negative for rash.  Neurological:  Negative for dizziness, weakness, light-headedness, numbness and headaches.  Hematological:  Negative for adenopathy.  Psychiatric/Behavioral:  Negative for behavioral problems, dysphoric mood and sleep disturbance.    Per  HPI unless specifically indicated above     Objective:    BP 112/74 (BP Location: Right Arm, Patient Position: Sitting, Cuff Size: Normal)   Pulse 60   Ht 5' 1 (1.549 m)   Wt 125 lb 6 oz (56.9 kg)   SpO2 99%   BMI 23.69 kg/m   Wt Readings from Last 3 Encounters:  01/15/24 125 lb 6 oz (56.9 kg)  01/06/24 125 lb 6.4 oz (56.9 kg)  12/19/23 125 lb (56.7 kg)    Physical Exam Vitals and nursing note reviewed.  Constitutional:      General: She is not in acute distress.    Appearance: She is well-developed. She is not diaphoretic.     Comments: Well-appearing thin elderly 78 yr female, comfortable, cooperative  HENT:  Head: Normocephalic and atraumatic.     Right Ear: Ear canal and external ear normal. There is no impacted cerumen.     Left Ear: Ear canal and external ear normal. There is no impacted cerumen.     Ears:     Comments: Clear fluid effusion bilateral Eyes:     General:        Right eye: No discharge.        Left eye: No discharge.     Conjunctiva/sclera: Conjunctivae normal.     Pupils: Pupils are equal, round, and reactive to light.  Neck:     Thyroid : No thyromegaly.  Cardiovascular:     Rate and Rhythm: Normal rate and regular rhythm.     Pulses: Normal pulses.     Heart sounds: Normal heart sounds. No murmur heard. Pulmonary:     Effort: Pulmonary effort is normal. No respiratory distress.     Breath sounds: Normal breath sounds. No wheezing or rales.  Abdominal:     General: Bowel sounds are normal. There is no distension.     Palpations: Abdomen is soft. There is no mass.     Tenderness: There is no abdominal tenderness.  Musculoskeletal:        General: No tenderness. Normal range of motion.     Cervical back: Normal range of motion and neck supple.     Comments: Upper / Lower Extremities: - Normal muscle tone, strength bilateral upper extremities 5/5, lower extremities 5/5  Lymphadenopathy:     Cervical: No cervical adenopathy.  Skin:     General: Skin is warm and dry.     Findings: No erythema or rash.  Neurological:     Mental Status: She is alert and oriented to person, place, and time.     Comments: Distal sensation intact to light touch all extremities  Psychiatric:        Mood and Affect: Mood normal.        Behavior: Behavior normal.        Thought Content: Thought content normal.     Comments: Well groomed, good eye contact, normal speech and thoughts     Results for orders placed or performed in visit on 01/01/23  TSH   Collection Time: 01/01/23 10:25 AM  Result Value Ref Range   TSH 3.25 0.40 - 4.50 mIU/L  Hemoglobin A1c   Collection Time: 01/01/23 10:25 AM  Result Value Ref Range   Hgb A1c MFr Bld 5.4 <5.7 % of total Hgb   Mean Plasma Glucose 108 mg/dL   eAG (mmol/L) 6.0 mmol/L  Lipid panel   Collection Time: 01/01/23 10:25 AM  Result Value Ref Range   Cholesterol 199 <200 mg/dL   HDL 74 > OR = 50 mg/dL   Triglycerides 882 <849 mg/dL   LDL Cholesterol (Calc) 104 (H) mg/dL (calc)   Total CHOL/HDL Ratio 2.7 <5.0 (calc)   Non-HDL Cholesterol (Calc) 125 <130 mg/dL (calc)  CBC with Differential/Platelet   Collection Time: 01/01/23 10:25 AM  Result Value Ref Range   WBC 6.8 3.8 - 10.8 Thousand/uL   RBC 4.47 3.80 - 5.10 Million/uL   Hemoglobin 14.1 11.7 - 15.5 g/dL   HCT 56.7 64.9 - 54.9 %   MCV 96.6 80.0 - 100.0 fL   MCH 31.5 27.0 - 33.0 pg   MCHC 32.6 32.0 - 36.0 g/dL   RDW 87.9 88.9 - 84.9 %   Platelets 261 140 - 400 Thousand/uL   MPV 10.1 7.5 - 12.5  fL   Neutro Abs 3,747 1,500 - 7,800 cells/uL   Lymphs Abs 2,244 850 - 3,900 cells/uL   Absolute Monocytes 462 200 - 950 cells/uL   Eosinophils Absolute 299 15 - 500 cells/uL   Basophils Absolute 48 0 - 200 cells/uL   Neutrophils Relative % 55.1 %   Total Lymphocyte 33.0 %   Monocytes Relative 6.8 %   Eosinophils Relative 4.4 %   Basophils Relative 0.7 %  COMPLETE METABOLIC PANEL WITH GFR   Collection Time: 01/01/23 10:25 AM  Result Value  Ref Range   Glucose, Bld 82 65 - 99 mg/dL   BUN 14 7 - 25 mg/dL   Creat 9.28 9.39 - 8.99 mg/dL   eGFR 87 > OR = 60 fO/fpw/8.26f7   BUN/Creatinine Ratio SEE NOTE: 6 - 22 (calc)   Sodium 140 135 - 146 mmol/L   Potassium 4.5 3.5 - 5.3 mmol/L   Chloride 105 98 - 110 mmol/L   CO2 27 20 - 32 mmol/L   Calcium  9.5 8.6 - 10.4 mg/dL   Total Protein 6.7 6.1 - 8.1 g/dL   Albumin 4.2 3.6 - 5.1 g/dL   Globulin 2.5 1.9 - 3.7 g/dL (calc)   AG Ratio 1.7 1.0 - 2.5 (calc)   Total Bilirubin 0.4 0.2 - 1.2 mg/dL   Alkaline phosphatase (APISO) 89 37 - 153 U/L   AST 19 10 - 35 U/L   ALT 14 6 - 29 U/L      Assessment & Plan:   Problem List Items Addressed This Visit     Insomnia   Pure hypercholesterolemia   Relevant Orders   TSH   Lipid panel   Comprehensive metabolic panel with GFR   Other Visit Diagnoses       Annual physical exam    -  Primary   Relevant Orders   TSH   Lipid panel   Hemoglobin A1c   CBC with Differential/Platelet   Comprehensive metabolic panel with GFR     Abnormal glucose       Relevant Orders   Hemoglobin A1c     Vitamin D  deficiency       Relevant Orders   VITAMIN D  25 Hydroxy (Vit-D Deficiency, Fractures)     Vitamin B12 deficiency       Relevant Orders   Vitamin B12        Updated Health Maintenance information Fasting labs ordered today, pending results Encouraged improvement to lifestyle with diet and exercise   Fatigue Likely related to current illness and antibiotic use. Expected to improve as the viral infection resolves and normal activities resume.  Insomnia Chronic insomnia with difficulty sleeping. Valium  has been used in the past, for long time with some beneficial results, but she has come off of medication hesitant to resume  We have discussed concerns about dependency and potential side effects / appropriateness in older adults. Trazodone  was ineffective. Discussed mirtazapine (Remeron) as an alternative, but she prefers to wait until  after completing antibiotics and resuming normal activities. - Offer to re order Diazepam  (Valium ) if needed for insomnia only, use with cautions given - If interested in alternative option, Consider mirtazapine (Remeron) if insomnia persists. - Reorder Valium  if requested.  Hypertension Blood pressure is well-controlled at 112/74 mmHg.  General Health Maintenance Discussed optional COVID booster, tetanus shot, and shingles vaccine. She routinely receives a flu shot in October. Pneumonia vaccine is recommended as per updated guidelines, and she is eligible for the next dose. - Administer  flu and pneumonia vaccines in October. - Consider optional COVID booster, tetanus shot, and shingles vaccine.  Laboratory Workup Blood work to include chemistry, kidney, liver, blood count, cholesterol, sugar, thyroid , vitamin D , and B12 levels. - Perform blood work as ordered. - Call with results.         Orders Placed This Encounter  Procedures   TSH   Lipid panel    Has the patient fasted?:   Yes   Hemoglobin A1c   CBC with Differential/Platelet   Comprehensive metabolic panel with GFR    Has the patient fasted?:   Yes   Vitamin B12   VITAMIN D  25 Hydroxy (Vit-D Deficiency, Fractures)    No orders of the defined types were placed in this encounter.    Follow up plan: Return for 1 year fasting lab > 1 week later Annual Physical.  Marsa Officer, DO [facility] 01/15/2024, 9:40 AM

## 2024-01-15 NOTE — Patient Instructions (Addendum)
 Thank you for coming to the office today.  October 2025, Flu Shot + Prevnar-20 pneumonia vaccine  Labs today, we will call with results.  If you decide to restart the Valium , please call back and we can order.  Please schedule a Follow-up Appointment to: Return for 1 year fasting lab > 1 week later Annual Physical.  If you have any other questions or concerns, please feel free to call the office or send a message through MyChart. You may also schedule an earlier appointment if necessary.  Additionally, you may be receiving a survey about your experience at our office within a few days to 1 week by e-mail or mail. We value your feedback.  Marsa Officer, DO [facility], [place]

## 2024-01-16 LAB — COMPREHENSIVE METABOLIC PANEL WITH GFR
AG Ratio: 1.6 (calc) (ref 1.0–2.5)
ALT: 34 U/L — ABNORMAL HIGH (ref 6–29)
AST: 29 U/L (ref 10–35)
Albumin: 4.4 g/dL (ref 3.6–5.1)
Alkaline phosphatase (APISO): 94 U/L (ref 37–153)
BUN: 15 mg/dL (ref 7–25)
CO2: 27 mmol/L (ref 20–32)
Calcium: 9.5 mg/dL (ref 8.6–10.4)
Chloride: 104 mmol/L (ref 98–110)
Creat: 0.72 mg/dL (ref 0.60–1.00)
Globulin: 2.7 g/dL (ref 1.9–3.7)
Glucose, Bld: 84 mg/dL (ref 65–99)
Potassium: 4.1 mmol/L (ref 3.5–5.3)
Sodium: 140 mmol/L (ref 135–146)
Total Bilirubin: 0.6 mg/dL (ref 0.2–1.2)
Total Protein: 7.1 g/dL (ref 6.1–8.1)
eGFR: 85 mL/min/1.73m2 (ref >=60)

## 2024-01-16 LAB — CBC WITH DIFFERENTIAL/PLATELET
Absolute Lymphocytes: 2539 {cells}/uL (ref 850–3900)
Absolute Monocytes: 536 {cells}/uL (ref 200–950)
Basophils Absolute: 47 {cells}/uL (ref 0–200)
Basophils Relative: 0.7 %
Eosinophils Absolute: 121 {cells}/uL (ref 15–500)
Eosinophils Relative: 1.8 %
HCT: 45.4 % — ABNORMAL HIGH (ref 35.0–45.0)
Hemoglobin: 15 g/dL (ref 11.7–15.5)
MCH: 32.8 pg (ref 27.0–33.0)
MCHC: 33 g/dL (ref 32.0–36.0)
MCV: 99.3 fL (ref 80.0–100.0)
MPV: 10.1 fL (ref 7.5–12.5)
Monocytes Relative: 8 %
Neutro Abs: 3457 {cells}/uL (ref 1500–7800)
Neutrophils Relative %: 51.6 %
Platelets: 276 Thousand/uL (ref 140–400)
RBC: 4.57 Million/uL (ref 3.80–5.10)
RDW: 12.2 % (ref 11.0–15.0)
Total Lymphocyte: 37.9 %
WBC: 6.7 Thousand/uL (ref 3.8–10.8)

## 2024-01-16 LAB — LIPID PANEL
Cholesterol: 200 mg/dL — ABNORMAL HIGH
HDL: 72 mg/dL
LDL Cholesterol (Calc): 105 mg/dL — ABNORMAL HIGH
Non-HDL Cholesterol (Calc): 128 mg/dL
Total CHOL/HDL Ratio: 2.8 (calc)
Triglycerides: 135 mg/dL

## 2024-01-16 LAB — TSH: TSH: 2.83 m[IU]/L (ref 0.40–4.50)

## 2024-01-16 LAB — HEMOGLOBIN A1C
Hgb A1c MFr Bld: 5.4 %
Mean Plasma Glucose: 108 mg/dL
eAG (mmol/L): 6 mmol/L

## 2024-01-16 LAB — VITAMIN B12: Vitamin B-12: 456 pg/mL (ref 200–1100)

## 2024-01-16 LAB — VITAMIN D 25 HYDROXY (VIT D DEFICIENCY, FRACTURES): Vit D, 25-Hydroxy: 66 ng/mL (ref 30–100)

## 2024-01-17 ENCOUNTER — Ambulatory Visit: Payer: Self-pay | Admitting: Family Medicine

## 2024-02-06 ENCOUNTER — Telehealth: Payer: Self-pay

## 2024-02-06 NOTE — Telephone Encounter (Signed)
 Please call her back to review:  I usually consider injections for < 300 range of B12. He result was 456.   She can check the amount of B12, but usually we recommend 1000mcg B12 daily in oral vitamins as a supplement. If she is not getting enough from the [facility] to = 1000 mcg, she can take an extra B12 pill.  Her other labs showed normal blood counts, thyroid , electrolytes, vitamin D .  Unfortunately I don't have a simple medical answer for why she is tired based on her blood work. Other considerations would be sleep, nutrition, and other factors.  If multivitamin + B12 oral does not help, and she has no other explanation for being tired related to sleep or something else, then I would say that we can try the B12 injections to see if they are helpful - but it is not a guarantee that it would solve her issue.  Let me know if she ultimately decides to try the injections and I can order, it would be B12 1000mcg monthly x 3 months as a trial first. She does not need weekly to start because her range is already normal.  Marsa Officer, DO [facility] 02/06/2024, 5:46 PM

## 2024-02-06 NOTE — Telephone Encounter (Signed)
 Copied from CRM [id number]. Topic: Clinical - Lab/Test Results >> Feb 06, 2024  1:32 PM Kim Ayala wrote: Reason for CRM: Patient would like to speak to someone in regards to her recent labs. Has questions about her B12.  Patient can be reached at [phone]

## 2024-02-07 NOTE — Telephone Encounter (Signed)
 She looked at her multivitamin and it says its 200 % of daily needed. She is trying to get back to the gym hoping this will help get some energy back. She will let us  know in a few weeks how feels and if she wants  B12 injections.

## 2024-03-02 DIAGNOSIS — H26493 Other secondary cataract, bilateral: Secondary | ICD-10-CM | POA: Diagnosis not present

## 2024-03-02 DIAGNOSIS — H04123 Dry eye syndrome of bilateral lacrimal glands: Secondary | ICD-10-CM | POA: Diagnosis not present

## 2024-03-02 DIAGNOSIS — H5203 Hypermetropia, bilateral: Secondary | ICD-10-CM | POA: Diagnosis not present

## 2024-03-13 ENCOUNTER — Ambulatory Visit

## 2024-04-02 ENCOUNTER — Encounter: Payer: Self-pay | Admitting: Family Medicine

## 2024-04-02 ENCOUNTER — Ambulatory Visit: Admitting: Family Medicine

## 2024-04-02 VITALS — BP 120/78 | HR 65 | Ht 61.0 in | Wt 127.2 lb

## 2024-04-02 DIAGNOSIS — F419 Anxiety disorder, unspecified: Secondary | ICD-10-CM | POA: Diagnosis not present

## 2024-04-02 DIAGNOSIS — M545 Low back pain, unspecified: Secondary | ICD-10-CM | POA: Diagnosis not present

## 2024-04-02 DIAGNOSIS — F5101 Primary insomnia: Secondary | ICD-10-CM

## 2024-04-02 MED ORDER — DIAZEPAM 5 MG PO TABS: 5.0000 mg | ORAL_TABLET | Freq: Every evening | ORAL | 2 refills | Status: AC | PRN

## 2024-04-02 NOTE — Patient Instructions (Addendum)
 Thank you for coming to the office today.  Re order Diazepam  for sleep / insomnia and also muscle spasms back pain. Use this nightly as needed, okay to skip doses if doing well and prefer to pause and not use regularly.  Caution with standing up quickly. Please stand slowly and pause for a minute or less and then when steady start walking, especially if wake up at night.  Okay to take Magnesium if doing well or can pause if causing dizzy   Please schedule a Follow-up Appointment to: Return in about 3 months (around 07/03/2024) for 3 month follow-up insomnia / anxiety updates.  If you have any other questions or concerns, please feel free to call the office or send a message through MyChart. You may also schedule an earlier appointment if necessary.  Additionally, you may be receiving a survey about your experience at our office within a few days to 1 week by e-mail or mail. We value your feedback.  Marsa Officer, DO [facility], [place]

## 2024-04-02 NOTE — Progress Notes (Signed)
 Subjective:    Patient ID: Kim Ayala, female    DOB: [birth year], 78 y.o.   MRN: [id number]  Kim Ayala is a 78 y.o. female presenting on 04/02/2024 for Insomnia   HPI  Discussed the use of AI scribe software for clinical note transcription with the patient, who gave verbal consent to proceed.  History of Present Illness   Kim Ayala is a 78 year old female who presents with sleep disturbances and back pain.  Insomnia Sleep disturbance  Anxiety - Difficulty falling asleep and frequent nocturnal awakenings - Symptoms have been persistent and previously discussed in September - Trazodone  previously trialed without benefit - Cautious about mirtazapine due to concerns about side effects - Diazepam  (Valium ) used as needed for sleep, effective without causing dizziness - Magnesium supplements may assist with sleep onset but cause dizziness upon awakening during the night - Cautious with medications due to risk of side effects and increased fall risk - Does not use diazepam  daily  Back pain and lower extremity discomfort - Recent fall approximately one month ago while walking after dinner, slipped on walnuts - Initial back pain lasted about one week and improved - Pain has recurred, attributed to use of a specific gym machine - Pain localized to the mid-back, with associated discomfort in the buttocks and leg - No imaging performed for current symptoms  Psychological stress - Increased stress related to current events and politics  Lifestyle and physical activity - Retired - Maintains a healthy diet and regular exercise routine, including walking after meals - Continues walking despite lack of sidewalks in her area           04/02/2024   10:14 AM 01/04/2023    2:46 PM 11/30/2021   10:52 AM  Depression screen PHQ 2/9  Decreased Interest 1 0 0  Down, Depressed, Hopeless 1 0 1  PHQ - 2 Score 2 0 1  Altered sleeping 3 0 1  Tired, decreased energy 3 0 1  Change in  appetite 1 0 0  Feeling bad or failure about yourself  0 0 0  Trouble concentrating 0 0 0  Moving slowly or fidgety/restless 0 0 0  Suicidal thoughts 0 0 0  PHQ-9 Score 9 0  3   Difficult doing work/chores Somewhat difficult Not difficult at all Not difficult at all     Data saved with a previous flowsheet row definition       04/02/2024   10:14 AM 01/04/2023    2:46 PM 11/30/2021   10:52 AM 12/06/2020    2:10 PM  GAD 7 : Generalized Anxiety Score  Nervous, Anxious, on Edge 1 0 0 0  Control/stop worrying 1 0 0 0  Worry too much - different things 1 0 0 0  Trouble relaxing 1 0 1 1  Restless 1 0 0 0  Easily annoyed or irritable 1 0 0 0  Afraid - awful might happen 1 0 1 1  Total GAD 7 Score 7 0 2 2  Anxiety Difficulty Somewhat difficult Not difficult at all Not difficult at all Not difficult at all    Social History   Tobacco Use   Smoking status: Never   Smokeless tobacco: Never  Substance Use Topics   Alcohol use: No    Alcohol/week: 0.0 standard drinks of alcohol   Drug use: No    Review of Systems Per HPI unless specifically indicated above     Objective:    BP 120/78 (BP Location: Left  Arm, Patient Position: Sitting, Cuff Size: Normal)   Pulse 65   Ht 5' 1 (1.549 m)   Wt 127 lb 4 oz (57.7 kg)   SpO2 97%   BMI 24.04 kg/m   Wt Readings from Last 3 Encounters:  04/02/24 127 lb 4 oz (57.7 kg)  01/15/24 125 lb 6 oz (56.9 kg)  01/06/24 125 lb 6.4 oz (56.9 kg)    Physical Exam Vitals and nursing note reviewed.  Constitutional:      General: She is not in acute distress.    Appearance: Normal appearance. She is well-developed. She is not diaphoretic.     Comments: Well-appearing, comfortable, cooperative  HENT:     Head: Normocephalic and atraumatic.  Eyes:     General:        Right eye: No discharge.        Left eye: No discharge.     Conjunctiva/sclera: Conjunctivae normal.  Cardiovascular:     Rate and Rhythm: Normal rate.  Pulmonary:     Effort:  Pulmonary effort is normal.  Skin:    General: Skin is warm and dry.     Findings: No erythema or rash.  Neurological:     Mental Status: She is alert and oriented to person, place, and time.  Psychiatric:        Mood and Affect: Mood normal.        Behavior: Behavior normal.        Thought Content: Thought content normal.     Comments: Well groomed, good eye contact, normal speech and thoughts     Results for orders placed or performed in visit on 01/15/24  TSH   Collection Time: 01/15/24 10:02 AM  Result Value Ref Range   TSH 2.83 0.40 - 4.50 mIU/L  Lipid panel   Collection Time: 01/15/24 10:02 AM  Result Value Ref Range   Cholesterol 200 (H) <200 mg/dL   HDL 72 > OR = 50 mg/dL   Triglycerides 864 <849 mg/dL   LDL Cholesterol (Calc) 105 (H) mg/dL (calc)   Total CHOL/HDL Ratio 2.8 <5.0 (calc)   Non-HDL Cholesterol (Calc) 128 <130 mg/dL (calc)  Hemoglobin J8r   Collection Time: 01/15/24 10:02 AM  Result Value Ref Range   Hgb A1c MFr Bld 5.4 <5.7 %   Mean Plasma Glucose 108 mg/dL   eAG (mmol/L) 6.0 mmol/L  CBC with Differential/Platelet   Collection Time: 01/15/24 10:02 AM  Result Value Ref Range   WBC 6.7 3.8 - 10.8 Thousand/uL   RBC 4.57 3.80 - 5.10 Million/uL   Hemoglobin 15.0 11.7 - 15.5 g/dL   HCT 54.5 (H) 64.9 - 54.9 %   MCV 99.3 80.0 - 100.0 fL   MCH 32.8 27.0 - 33.0 pg   MCHC 33.0 32.0 - 36.0 g/dL   RDW 87.7 88.9 - 84.9 %   Platelets 276 140 - 400 Thousand/uL   MPV 10.1 7.5 - 12.5 fL   Neutro Abs 3,457 1,500 - 7,800 cells/uL   Absolute Lymphocytes 2,539 850 - 3,900 cells/uL   Absolute Monocytes 536 200 - 950 cells/uL   Eosinophils Absolute 121 15 - 500 cells/uL   Basophils Absolute 47 0 - 200 cells/uL   Neutrophils Relative % 51.6 %   Total Lymphocyte 37.9 %   Monocytes Relative 8.0 %   Eosinophils Relative 1.8 %   Basophils Relative 0.7 %  Comprehensive metabolic panel with GFR   Collection Time: 01/15/24 10:02 AM  Result Value Ref Range   Glucose,  Bld 84 65 - 99 mg/dL   BUN 15 7 - 25 mg/dL   Creat 9.27 9.39 - 8.99 mg/dL   eGFR 85 > OR = 60 fO/fpw/8.26f7   BUN/Creatinine Ratio SEE NOTE: 6 - 22 (calc)   Sodium 140 135 - 146 mmol/L   Potassium 4.1 3.5 - 5.3 mmol/L   Chloride 104 98 - 110 mmol/L   CO2 27 20 - 32 mmol/L   Calcium  9.5 8.6 - 10.4 mg/dL   Total Protein 7.1 6.1 - 8.1 g/dL   Albumin 4.4 3.6 - 5.1 g/dL   Globulin 2.7 1.9 - 3.7 g/dL (calc)   AG Ratio 1.6 1.0 - 2.5 (calc)   Total Bilirubin 0.6 0.2 - 1.2 mg/dL   Alkaline phosphatase (APISO) 94 37 - 153 U/L   AST 29 10 - 35 U/L   ALT 34 (H) 6 - 29 U/L  Vitamin B12   Collection Time: 01/15/24 10:02 AM  Result Value Ref Range   Vitamin B-12 456 200 - 1,100 pg/mL  VITAMIN D  25 Hydroxy (Vit-D Deficiency, Fractures)   Collection Time: 01/15/24 10:02 AM  Result Value Ref Range   Vit D, 25-Hydroxy 66 30 - 100 ng/mL      Assessment & Plan:   Problem List Items Addressed This Visit     Anxiety   Relevant Medications   diazepam  (VALIUM ) 5 MG tablet   Insomnia - Primary   Relevant Medications   diazepam  (VALIUM ) 5 MG tablet   Other Visit Diagnoses       Acute left-sided low back pain without sciatica       Relevant Medications   diazepam  (VALIUM ) 5 MG tablet        Primary insomnia Anxiety Chronic insomnia with difficulty initiating and maintaining sleep. Previous trials of trazodone  ineffective. Magnesium provided mild benefit.  Previously Diazepam  effective without dizziness but she has been off BDZ.  - Prescribed diazepam  5 mg, 30 pills, as needed at bedtime. - Advised against nightly use to allow breaks. Discussion on benzodiazpine usage and withdrawal / dependence risks and potential side effects - Considered Mirtazapine for insomnia, we agreed to pause this and resume Diazepam  since effective and well tolerated - Scheduled follow-up in three months for reassessment.  Acute on chronic Low back pain Pain exacerbated by fall, radiates to buttocks and leg,  likely back-related. Improved post-injury but recurred with gym activity. No imaging unless symptoms persist or worsen. - Monitor symptoms, avoid imaging unless symptoms persist or worsen.        No orders of the defined types were placed in this encounter.   Meds ordered this encounter  Medications   diazepam  (VALIUM ) 5 MG tablet    Sig: Take 1 tablet (5 mg total) by mouth at bedtime as needed for anxiety or muscle spasms.    Dispense:  30 tablet    Refill:  2    Follow up plan: Return in about 3 months (around 07/03/2024) for 3 month follow-up insomnia / anxiety updates.   Marsa Officer, DO [facility] 04/02/2024, 10:42 AM

## 2024-04-13 ENCOUNTER — Telehealth: Payer: Self-pay

## 2024-04-13 NOTE — Telephone Encounter (Signed)
 Copied from CRM #[id number]. Topic: Clinical - Medical Advice >> Apr 13, 2024  2:12 PM Lonell PEDLAR wrote: Reason for CRM:  Patient would like good recommendation for daily vitamins from pcp. Has been taking [employer] and is looking to take something else. C/B: [phone]

## 2024-04-13 NOTE — Telephone Encounter (Signed)
 Please let her know that I don't have specific Vitamin brand recommendations. When it comes to a Daily Multivitamin, I don't have a favorite. But I usually recommend common brands that are available at most pharmacy locations OTC such as - Centrum Silver, Lysle Made, One A Day. Usually it is age based and different version for female / female etc.   Marsa Officer, DO [facility] 04/13/2024, 5:36 PM

## 2024-04-14 NOTE — Telephone Encounter (Signed)
 Spoke with patient gave her the recommendation of multivitamin

## 2024-06-09 ENCOUNTER — Ambulatory Visit: Payer: Self-pay

## 2024-06-09 NOTE — Telephone Encounter (Signed)
 FYI Only or Action Required?: Action required by provider: clinical question for provider.  Patient was last seen in primary care on 04/02/2024 by Edman Marsa PARAS, DO.  Called Nurse Triage reporting Cough.  Symptoms began several days ago.  Interventions attempted: OTC medications: Tylenol , cold and flu cough.  Symptoms are: gradually worsening.  Triage Disposition: Go to ED Now (or PCP Triage)  Patient/caregiver understands and will follow disposition?: Yes   Reason for Triage: coughing,congestion, not feeling well she has terrible has low fever   Reason for Disposition  Patient sounds very sick or weak to the triager  Answer Assessment - Initial Assessment Questions Patient says she traveled to [facility] on last Friday to be out of the weather and to have heat. She says on Friday night she started coughing and is still coughing up off white, thick sputum, sore/raw throat, feeling weak, temp 98.5. She's been taking OTC Cold and Flu cough medicine and Tylenol . She is asking for a virtual visit. Advised due to being in [facility] unable to schedule a virtual visit, advised UC. She says she doesn't want to go there being exposed to all the germs. She asked if Dr. Edman would send in a z-pack to [facility] [address] [place], [place]. Advised I will send this to him and someone will call with his recommendation.    1. ONSET: When did the cough begin?      Last Friday  2. SEVERITY: How bad is the cough today?      Moderate-Severe  3. SPUTUM: Describe the color of your sputum (e.g., none, dry cough; clear, white, yellow, green)     Off white, thick  4. HEMOPTYSIS: Are you coughing up any blood? If Yes, ask: How much? (e.g., flecks, streaks, tablespoons, etc.)     No  5. DIFFICULTY BREATHING: Are you having difficulty breathing? If Yes, ask: How bad is it? (e.g., mild, moderate, severe)      No  6. FEVER: Do you have a fever? If Yes, ask: What is your  temperature, how was it measured, and when did it start?     98.5      10. OTHER SYMPTOMS: Do you have any other symptoms? (e.g., runny nose, wheezing, chest pain)       Weakness, throat raw/sore  Protocols used: Cough - Acute Productive-A-AH

## 2024-06-09 NOTE — Telephone Encounter (Signed)
 Spoke with patient, explained to her and her son that we can not treat her out of state or call in medication with out seeing her. He then said I can not believe this God #### shit... why does she have to go be seen at a damn urgent care when Dr Edman can just call something in. Explain to them again that we would have to see her and we can't treat her out of state.

## 2024-06-19 ENCOUNTER — Telehealth: Payer: Self-pay

## 2024-06-19 ENCOUNTER — Ambulatory Visit: Payer: Self-pay

## 2024-06-19 NOTE — Telephone Encounter (Signed)
 Copied from CRM [id number]. Topic: Clinical - Medical Advice >> Jun 19, 2024 12:43 PM Terri G wrote: Reason for CRM: Patient wanted to let Nurse Sarah that she is going to the urgent care.

## 2024-06-19 NOTE — Telephone Encounter (Signed)
 Left message for patient to return call OK to advise  she will need to go to urgent care if she wants treatment prior to next week. Due to all schedules being full

## 2024-06-19 NOTE — Telephone Encounter (Signed)
 Pt c/o URI symptoms x 2 weeks, if she can be seen in office before the weekend please call her and advise. Otherwise she was encouraged to seek care at [facility].   FYI Only or Action Required?: Action required by provider: request for appointment.  Patient was last seen in primary care on 04/02/2024 by Edman Marsa PARAS, DO.  Called Nurse Triage reporting Cough, Sore Throat, and Fever.  Symptoms began several weeks ago.  Interventions attempted: Nothing.  Symptoms are: gradually worsening.  Triage Disposition: See Physician Within 24 Hours  Patient/caregiver understands and will follow disposition?: Yes   Copied from CRM #[id number]. Topic: Clinical - Medical Advice >> Jun 18, 2024  5:12 PM Santiya F wrote: Reason for CRM: Patient is calling in because she has been having a cough and congestion and phlegm in her chest and it's been about a week and she's taken over the counter medication but it hasn't been helping. Patient wants to know if he can call her or give her some suggestions on something else she can take to help it. Reason for Disposition  SEVERE coughing spells (e.g., whooping sound after coughing, vomiting after coughing)  Answer Assessment - Initial Assessment Questions 1. ONSET: When did the cough begin?      2 weeks  2. SEVERITY: How bad is the cough today?      This morning it's not as bad. Last night I didn't sleep an hour.   3. SPUTUM: Describe the color of your sputum (e.g., none, dry cough; clear, white, yellow, green)     Ungodly amounts at a time. A horrible white. It's not any blood. A light green.  4. HEMOPTYSIS: Are you coughing up any blood? If Yes, ask: How much? (e.g., flecks, streaks, tablespoons, etc.)     Denies  5. DIFFICULTY BREATHING: Are you having difficulty breathing? If Yes, ask: How bad is it? (e.g., mild, moderate, severe)      Pt noted to be speaking comfortably in multiple full sentences.   6. FEVER: Do you have a  fever? If Yes, ask: What is your temperature, how was it measured, and when did it start?     Denies  7. CARDIAC HISTORY: Do you have any history of heart disease? (e.g., heart attack, congestive heart failure)      No  8. LUNG HISTORY: Do you have any history of lung disease?  (e.g., pulmonary embolus, asthma, emphysema)     No  10. OTHER SYMPTOMS: Do you have any other symptoms? (e.g., runny nose, wheezing, chest pain)       HA, a little wheezing, denies CP  Protocols used: Cough - Acute Productive-A-AH

## 2024-06-19 NOTE — Telephone Encounter (Signed)
 Noted.

## 2024-06-23 ENCOUNTER — Ambulatory Visit: Admitting: Internal Medicine

## 2024-07-06 ENCOUNTER — Ambulatory Visit: Admitting: Family Medicine
